# Patient Record
Sex: Female | Born: 1945 | Race: White | Hispanic: No | Smoking: Former smoker
Health system: Southern US, Community
[De-identification: ages and names within clinical notes are randomized; demographics above are authoritative.]

## PROBLEM LIST (undated history)

## (undated) DIAGNOSIS — Z972 Presence of dental prosthetic device (complete) (partial): Secondary | ICD-10-CM

## (undated) HISTORY — PX: COLON SURGERY: SHX602

## (undated) HISTORY — DX: Presence of dental prosthetic device (complete) (partial): Z97.2

---

## 1998-08-05 ENCOUNTER — Other Ambulatory Visit: Admission: RE | Admit: 1998-08-05 | Discharge: 1998-08-05 | Payer: Self-pay | Admitting: Obstetrics & Gynecology

## 1999-08-06 ENCOUNTER — Other Ambulatory Visit: Admission: RE | Admit: 1999-08-06 | Discharge: 1999-08-06 | Payer: Self-pay | Admitting: Obstetrics and Gynecology

## 1999-11-12 ENCOUNTER — Other Ambulatory Visit: Admission: RE | Admit: 1999-11-12 | Discharge: 1999-11-12 | Payer: Self-pay | Admitting: Obstetrics and Gynecology

## 1999-12-02 ENCOUNTER — Encounter (INDEPENDENT_AMBULATORY_CARE_PROVIDER_SITE_OTHER): Payer: Self-pay

## 1999-12-02 ENCOUNTER — Other Ambulatory Visit: Admission: RE | Admit: 1999-12-02 | Discharge: 1999-12-02 | Payer: Self-pay | Admitting: Obstetrics and Gynecology

## 2001-09-17 ENCOUNTER — Other Ambulatory Visit: Admission: RE | Admit: 2001-09-17 | Discharge: 2001-09-17 | Payer: Self-pay | Admitting: Obstetrics and Gynecology

## 2001-12-26 HISTORY — PX: CHOLECYSTECTOMY: SHX55

## 2002-03-01 ENCOUNTER — Other Ambulatory Visit: Admission: RE | Admit: 2002-03-01 | Discharge: 2002-03-01 | Payer: Self-pay | Admitting: Obstetrics and Gynecology

## 2002-09-27 ENCOUNTER — Encounter: Payer: Self-pay | Admitting: Family Medicine

## 2002-09-27 ENCOUNTER — Ambulatory Visit (HOSPITAL_COMMUNITY): Admission: RE | Admit: 2002-09-27 | Discharge: 2002-09-27 | Payer: Self-pay | Admitting: Family Medicine

## 2002-10-14 ENCOUNTER — Encounter: Payer: Self-pay | Admitting: General Surgery

## 2002-10-21 ENCOUNTER — Encounter: Payer: Self-pay | Admitting: General Surgery

## 2002-10-21 ENCOUNTER — Encounter (INDEPENDENT_AMBULATORY_CARE_PROVIDER_SITE_OTHER): Payer: Self-pay

## 2002-10-21 ENCOUNTER — Observation Stay (HOSPITAL_COMMUNITY): Admission: RE | Admit: 2002-10-21 | Discharge: 2002-10-22 | Payer: Self-pay | Admitting: General Surgery

## 2003-04-11 ENCOUNTER — Other Ambulatory Visit: Admission: RE | Admit: 2003-04-11 | Discharge: 2003-04-11 | Payer: Self-pay | Admitting: Obstetrics and Gynecology

## 2004-05-10 ENCOUNTER — Other Ambulatory Visit: Admission: RE | Admit: 2004-05-10 | Discharge: 2004-05-10 | Payer: Self-pay | Admitting: Obstetrics and Gynecology

## 2004-05-21 ENCOUNTER — Encounter: Admission: RE | Admit: 2004-05-21 | Discharge: 2004-05-21 | Payer: Self-pay | Admitting: Obstetrics and Gynecology

## 2004-12-09 ENCOUNTER — Other Ambulatory Visit: Admission: RE | Admit: 2004-12-09 | Discharge: 2004-12-09 | Payer: Self-pay | Admitting: Obstetrics and Gynecology

## 2005-06-20 ENCOUNTER — Other Ambulatory Visit: Admission: RE | Admit: 2005-06-20 | Discharge: 2005-06-20 | Payer: Self-pay | Admitting: Obstetrics and Gynecology

## 2005-12-05 ENCOUNTER — Other Ambulatory Visit: Admission: RE | Admit: 2005-12-05 | Discharge: 2005-12-05 | Payer: Self-pay | Admitting: Obstetrics and Gynecology

## 2009-05-27 ENCOUNTER — Encounter: Admission: RE | Admit: 2009-05-27 | Discharge: 2009-05-27 | Payer: Self-pay | Admitting: Family Medicine

## 2009-06-01 ENCOUNTER — Encounter: Admission: RE | Admit: 2009-06-01 | Discharge: 2009-06-01 | Payer: Self-pay | Admitting: Family Medicine

## 2010-02-25 IMAGING — CR DG FOOT 2V*R*
2 series · 2 of 2 positions shown · non-contrast
Comparison: None

CLINICAL DATA: Foot and ankle pain

RIGHT FOOT - 2 VIEW

[view not recorded (1 of 2)]
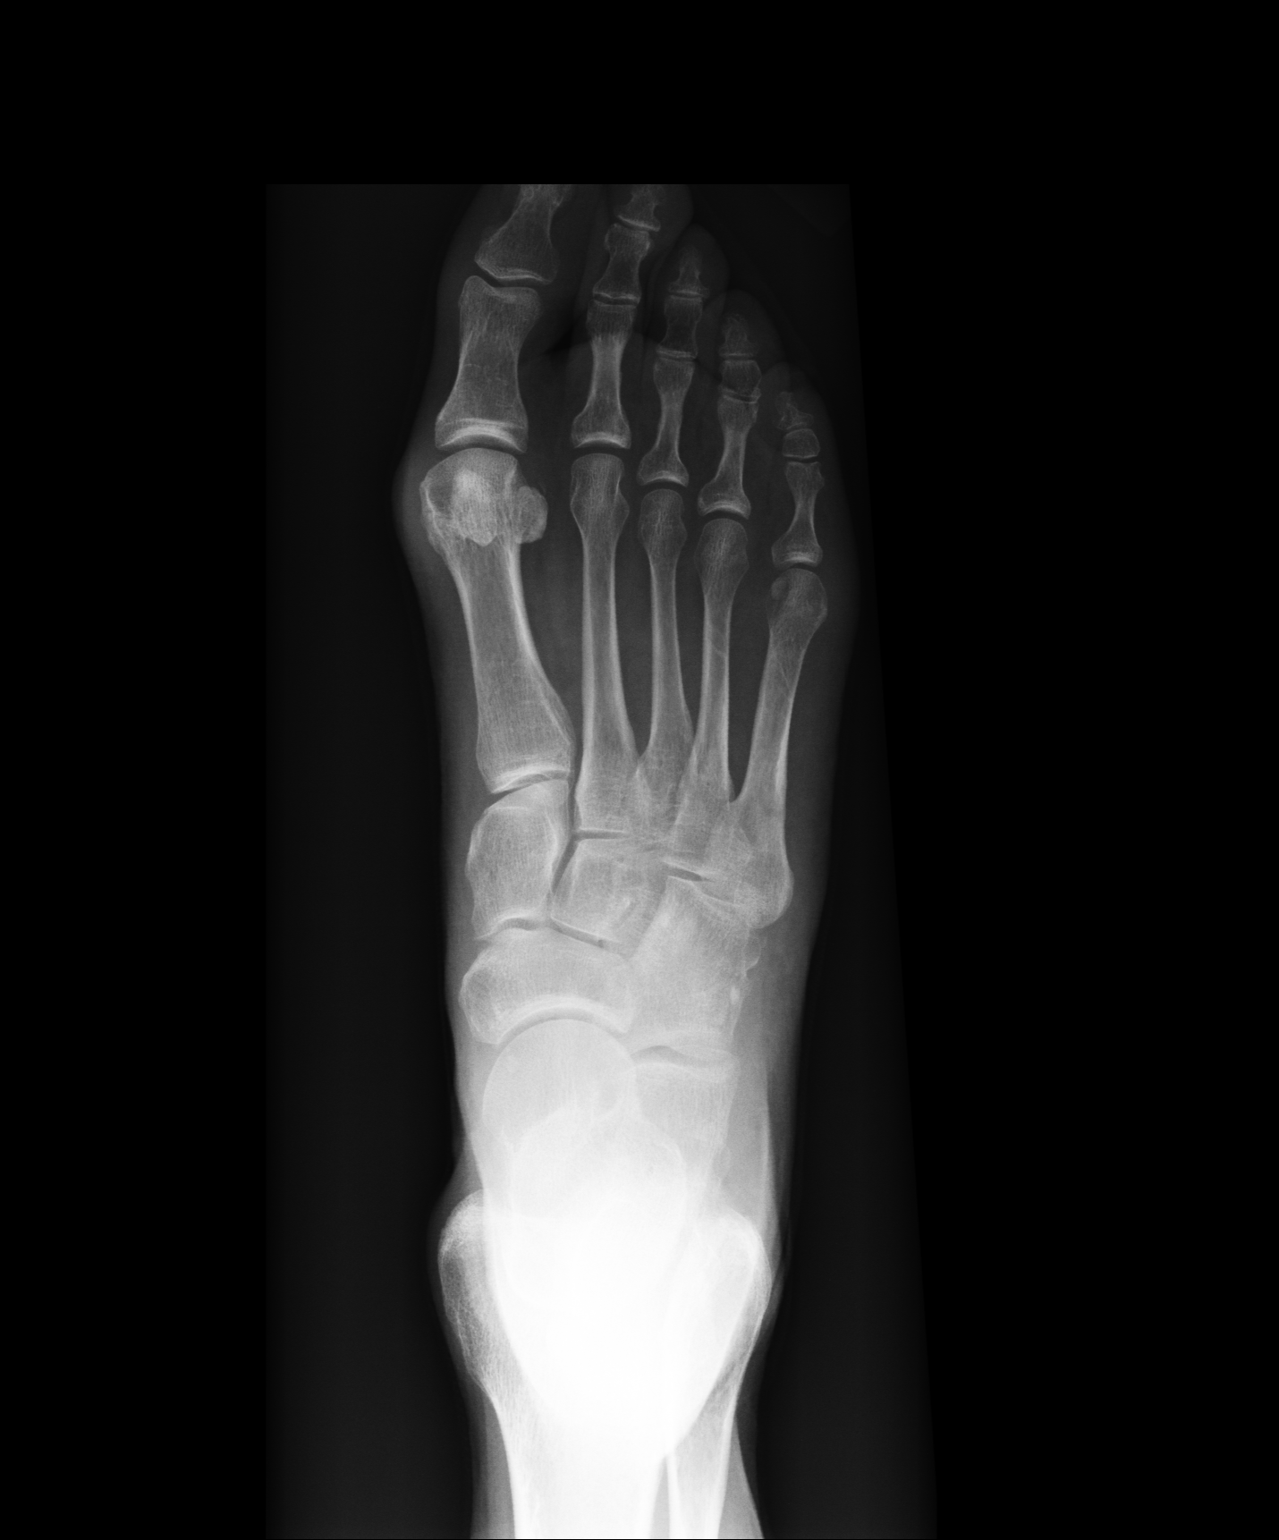

[view not recorded (2 of 2)]
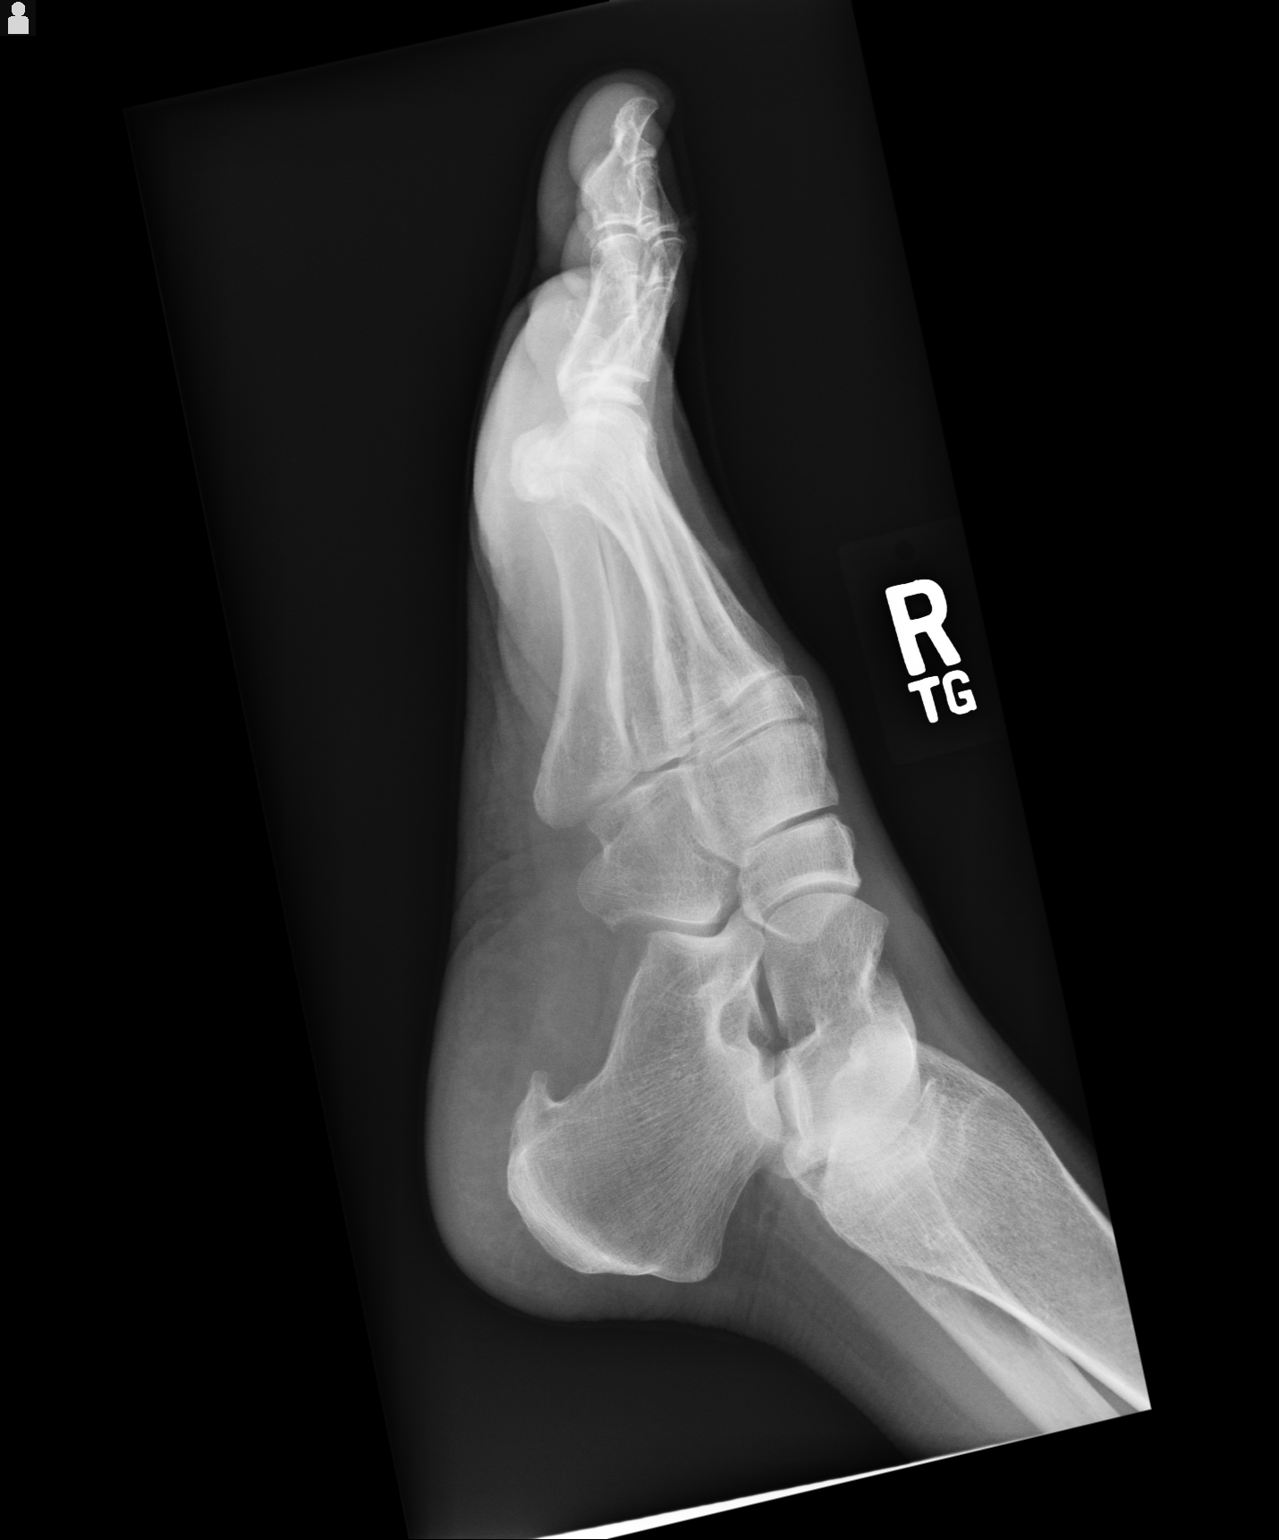

[2 of 2 positions shown; findings below may reference images not displayed]

FINDINGS: There is lucency at the head of the fifth metatarsal of
unknown significance.  No cortical breakthrough or periosteal
reaction.  Soft tissues are within normal limits.  No acute
fracture or dislocation.  Mild degenerative change of the first
metatarsal-phalangeal joint.  Spurring at the inferior calcaneus.
IMPRESSION: No acute bony injury.

Degenerative change

Apparent lytic area at the head of the fifth metatarsal.  Complete
three view study is recommended.

## 2011-05-13 NOTE — Op Note (Signed)
NAME:  Erika Buck, Erika Buck                            ACCOUNT NO.:  [id number]   MEDICAL RECORD NO.:  [id number]                   PATIENT TYPE:  AMB   LOCATION:  DAY                                  FACILITY:  [facility]   PHYSICIAN:  Ollen Gross. Vernell Morgans, M.D.              DATE OF BIRTH:  10-04-1946   DATE OF PROCEDURE:  10/21/2002  DATE OF DISCHARGE:                                 OPERATIVE REPORT   PREOPERATIVE DIAGNOSES:  Cholelithiasis with cholecystitis.   POSTOPERATIVE DIAGNOSES:  Cholelithiasis with cholecystitis.   PROCEDURE:  Laparoscopic cholecystectomy with intraoperative cholangiogram.   SURGEON:  Ollen Gross. Carolynne Edouard, M.D.   ASSISTANT:  Abigail Miyamoto, MD   ANESTHESIA:  General endotracheal.   DESCRIPTION OF PROCEDURE:  After informed consent was obtained, the patient  was brought to the operating room, placed in supine position on the  operating room table. After adequate induction of general anesthesia, the  patient's abdomen was prepped with Betadine and draped in the usual sterile  manner. The area below the umbilicus was infiltrated with 0.25% Marcaine. A  small incision was made with a 15 blade knife. Blunt dissection was then  carried out through the subcutaneous tissue using blunt dissection with the  Kelly clamp and [employer] retractors until the linea alba was identified.  The linea was incised with the 15 blade knife and each side was grasped with  Kocher clamps and elevated anteriorly. The preperitoneal space was then  probed bluntly with a hemostat until the peritoneum was opened and access  was gained to the abdominal cavity. A finger was inserted through this  opening and there were no obvious adhesions to the anterior abdominal wall.  A #0 Vicryl pursestring stitch was placed in the fascia surrounding this  opening. A Hasson cannula was then placed through this opening and anchored  in place with the previously placed Vicryl pursestring stitch. The abdomen  was then  insufflated with carbon dioxide without difficulty. The patient  placed in a head up position, laparoscope was placed through the Hasson  cannula and the gallbladder appeared to be very large and floppy but was  readily identifiable. Next after infiltrating the area with 0.25% Marcaine,  a small upper midline transverse incision was made with a 15 blade knife. A  10 mm port was then placed bluntly through this incision into the abdominal  cavity under direct vision. Sites were then chosen laterally on the right  side of the abdomen for placement of the 5 mm ports. Each of these areas was  infiltrated with 0.25% Marcaine. Small stab incisions were made with the 15  blade knife. The 5 mm ports were then placed through these incisions into  the abdominal cavity under direct vision. A blunt grasper was placed through  the lateral most 5 mm port and used to grasp the dome of the gallbladder and  elevate it anteriorly  and superiorly. Another blunt grasper was placed  through the other 5 mm port and used to retract on the body and neck of the  gallbladder. A dissector was placed through the upper midline port. Using  the electrocautery, the peritoneal reflection at the gallbladder neck was  opened, blunt dissection was then carried out in this area until the  gallbladder cystic duct junction was readily identified and dissected in a  circumferential manner. Care was taken to keep the common duct medial to  this dissection. Once this was accomplished, a clip was placed on the  gallbladder neck, a small ductotomy was made with the laparoscopic scissors.  A 12 gauge angiocath was placed percutaneously through the anterior  abdominal wall and a Reddick catheter was placed through the angiocath. The  catheter was placed within the cystic duct and anchored in placed with a  clip. Next, a cholangiogram was obtained that showed good filling of the  bile duct system. There were no filling defects and there  was rapid emptying  into the duodenum. The patient was then repositioned, the clip was removed  and the angiocath and Reddick catheter were removed from the patient. There  clips were then placed proximally on the cystic duct and the duct was  divided between the two sets of clips. Posterior to this, the cystic artery  was identified and again dissected in a circumferential manner. Two clips  were placed proximally and one distally on the artery and the artery was  divided between the two. Next, the laparoscopic hook device was used to  separate the gallbladder from the liver bed. Prior to completely detaching  the gallbladder from the liver bed, the liver bed was inspected and several  small bleeding points were coagulated with the electrocautery. The  gallbladder was then detached the rest of the way of the liver bed. An  endoscopic bag was placed through the upper midline port and the gallbladder  was placed within the bag and the bag was sealed. The laparoscope was then  moved to the upper midline port and a gallbladder grasper was placed through  the Hasson cannula. I then grasped the opening of the bag. The bag with the  gallbladder was then removed with the Hasson cannula through the  infraumbilical port without difficulty. The fascial defect was then closed  with the previously placed Vicryl pursestring stitch. An extra Vicryl stitch  was used to finish closing the fascial defect. The abdomen was then  irrigated with copious amounts of saline until the affluent was clear. The  liver bed was inspected and again found to be hemostatic. The ports were  then all removed under direct vision and the gas was allowed to escape. The  skin incisions were closed with interrupted 4-0 Monocryl subcuticular  stitches. Benzoin and Steri-Strips were applied. The patient tolerated the  procedure well. At the end of the case, all sponge, needle and instrument counts were correct. The patient was  awakened and taken to the recovery room  in stable condition.                                               Ollen Gross. Vernell Morgans, M.D.    PST/MEDQ  D:  10/21/2002  T:  10/21/2002  Job:  [id number]

## 2011-07-08 ENCOUNTER — Other Ambulatory Visit: Payer: Self-pay | Admitting: Gastroenterology

## 2011-08-02 ENCOUNTER — Encounter (INDEPENDENT_AMBULATORY_CARE_PROVIDER_SITE_OTHER): Payer: Self-pay | Admitting: Surgery

## 2011-08-02 ENCOUNTER — Ambulatory Visit (INDEPENDENT_AMBULATORY_CARE_PROVIDER_SITE_OTHER): Payer: 59 | Admitting: Surgery

## 2011-08-02 VITALS — BP 144/90 | HR 70 | Temp 97.4°F | Ht 64.0 in | Wt 102.4 lb

## 2011-08-02 DIAGNOSIS — K6389 Other specified diseases of intestine: Secondary | ICD-10-CM | POA: Insufficient documentation

## 2011-08-02 NOTE — Progress Notes (Signed)
Erika Buck is a 65 y.o. female.    Chief Complaint  Patient presents with  . 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 It was too large to be removed endoscopically. She is asymptomatic. She has no problems moving her bowels. She has no obstructive symptoms. The pathology showed no high-grade dysplasia. She is otherwise without complaints.  Past Medical History  Diagnosis Date  . Wears dentures     Past Surgical History  Procedure Date  . Cholecystectomy 2003    Family History  Problem Relation Age of Onset  . Heart disease Mother   . Cancer Brother     Social History History  Substance Use Topics  . Smoking status: Current Everyday Smoker -- 0.5 packs/day  . Smokeless tobacco: Not on file  . Alcohol Use: No    No Known Allergies  Current Outpatient Prescriptions  Medication Sig Dispense Refill  . Alendronate Sodium (FOSAMAX PO) Take by mouth once a week.          Review of Systems ROS An extensive 16 point review of systems was reviewed with the patient. It is negative from a cardiopulmonary standpoint and otherwise negative. Physical Exam Physical Exam  Constitutional: She is oriented to person, place, and time. She appears well-developed and well-nourished. No distress.  HENT:  Head: Normocephalic and atraumatic.  Right Ear: External ear normal.  Left Ear: External ear normal.  Nose: Nose normal.  Mouth/Throat: Oropharynx is clear and moist. No oropharyngeal exudate.  Eyes: Conjunctivae are normal. Pupils are equal, round, and reactive to light. No scleral icterus.  Neck: Normal range of motion. Neck supple. No tracheal deviation present. No thyromegaly present.  Cardiovascular: Normal rate, regular rhythm, normal heart sounds and intact distal pulses.   No murmur heard. Respiratory: Effort normal and breath sounds normal. No  respiratory distress. She has no wheezes. She has no rales.  GI: Soft. Bowel sounds are normal. She exhibits no distension and no mass. There is no tenderness.  Musculoskeletal: Normal range of motion. She exhibits no edema and no tenderness.  Lymphadenopathy:    She has no cervical adenopathy.    She has no axillary adenopathy.       Right: No inguinal adenopathy present.       Left: No inguinal adenopathy present.  Neurological: She is alert and oriented to person, place, and time.  Skin: Skin is warm and dry. No rash noted. No erythema.  Psychiatric: Her behavior is normal. Judgment normal.     Blood pressure 144/90, pulse 70, temperature 97.4 F (36.3 C), temperature source Temporal, height 5\' 4"  (1.626 m), weight 102 lb 6.4 oz (46.448 kg).  Assessment/Plan This is a patient with a large tubular adenoma of the cecum which could not be removed endoscopically. Left ectopic assisted right partial colectomy is recommended. I discussed this with the patient and her family. I discussed the risk of surgery which include but are not limited to bleeding, infection, anastomotic leak, anastomotic breakdown, need for an ostomy, injury to other structures, etc. She understands and wishes to proceed. Surgery will be scheduled.  Cleofas Hudgins A 08/02/2011, 11:43 AM

## 2011-09-12 ENCOUNTER — Other Ambulatory Visit (INDEPENDENT_AMBULATORY_CARE_PROVIDER_SITE_OTHER): Payer: Self-pay | Admitting: Surgery

## 2011-09-12 ENCOUNTER — Encounter (HOSPITAL_COMMUNITY): Payer: 59

## 2011-09-12 LAB — CBC
HCT: 42.5 % (ref 36.0–46.0)
MCH: 32.3 pg (ref 26.0–34.0)
MCV: 95.3 fL (ref 78.0–100.0)
Platelets: 306 10*3/uL (ref 150–400)
RBC: 4.46 MIL/uL (ref 3.87–5.11)
WBC: 8.7 10*3/uL (ref 4.0–10.5)

## 2011-09-16 ENCOUNTER — Other Ambulatory Visit (INDEPENDENT_AMBULATORY_CARE_PROVIDER_SITE_OTHER): Payer: Self-pay | Admitting: Surgery

## 2011-09-16 ENCOUNTER — Inpatient Hospital Stay (HOSPITAL_COMMUNITY)
Admission: RE | Admit: 2011-09-16 | Discharge: 2011-09-20 | DRG: 330 | Disposition: A | Discharge status: Home / Self Care | Payer: 59 | Source: Ambulatory Visit | Attending: Surgery | Admitting: Surgery

## 2011-09-16 DIAGNOSIS — Z5331 Laparoscopic surgical procedure converted to open procedure: Secondary | ICD-10-CM

## 2011-09-16 DIAGNOSIS — D371 Neoplasm of uncertain behavior of stomach: Secondary | ICD-10-CM

## 2011-09-16 DIAGNOSIS — D126 Benign neoplasm of colon, unspecified: Principal | ICD-10-CM | POA: Diagnosis present

## 2011-09-16 DIAGNOSIS — K921 Melena: Secondary | ICD-10-CM | POA: Diagnosis not present

## 2011-09-16 DIAGNOSIS — Z01812 Encounter for preprocedural laboratory examination: Secondary | ICD-10-CM

## 2011-09-16 DIAGNOSIS — D378 Neoplasm of uncertain behavior of other specified digestive organs: Secondary | ICD-10-CM

## 2011-09-16 DIAGNOSIS — F17200 Nicotine dependence, unspecified, uncomplicated: Secondary | ICD-10-CM | POA: Diagnosis present

## 2011-09-16 DIAGNOSIS — D375 Neoplasm of uncertain behavior of rectum: Secondary | ICD-10-CM

## 2011-09-16 HISTORY — PX: PARTIAL COLECTOMY: SHX5273

## 2011-09-17 LAB — CBC
Hemoglobin: 10.8 g/dL — ABNORMAL LOW (ref 12.0–15.0)
Hemoglobin: 10.6 g/dL — ABNORMAL LOW (ref 12.0–15.0)
MCH: 32.5 pg (ref 26.0–34.0)
MCH: 32.6 pg (ref 26.0–34.0)
MCHC: 33.8 g/dL (ref 30.0–36.0)
Platelets: 268 10*3/uL (ref 150–400)
Platelets: 257 10*3/uL (ref 150–400)
RBC: 3.25 MIL/uL — ABNORMAL LOW (ref 3.87–5.11)
RDW: 13.4 % (ref 11.5–15.5)
WBC: 14.7 10*3/uL — ABNORMAL HIGH (ref 4.0–10.5)

## 2011-09-17 LAB — BASIC METABOLIC PANEL
Calcium: 8.1 mg/dL — ABNORMAL LOW (ref 8.4–10.5)
GFR calc Af Amer: >60 mL/min (ref >60)
GFR calc non Af Amer: >60 mL/min (ref >60)
Potassium: 4 mEq/L (ref 3.5–5.1)
Sodium: 140 mEq/L (ref 135–145)

## 2011-09-17 LAB — DIFFERENTIAL
Basophils Absolute: 0 10*3/uL (ref 0.0–0.1)
Basophils Relative: 0 % (ref 0–1)
Eosinophils Absolute: 0 10*3/uL (ref 0.0–0.7)
Monocytes Absolute: 1.6 10*3/uL — ABNORMAL HIGH (ref 0.1–1.0)
Monocytes Relative: 11 % (ref 3–12)
Neutro Abs: 11.2 10*3/uL — ABNORMAL HIGH (ref 1.7–7.7)

## 2011-09-18 LAB — CBC
HCT: 32.6 % — ABNORMAL LOW (ref 36.0–46.0)
MCV: 97.6 fL (ref 78.0–100.0)
RBC: 3.34 MIL/uL — ABNORMAL LOW (ref 3.87–5.11)
RDW: 13.4 % (ref 11.5–15.5)
WBC: 11.9 10*3/uL — ABNORMAL HIGH (ref 4.0–10.5)

## 2011-09-18 LAB — BASIC METABOLIC PANEL
BUN: 5 mg/dL — ABNORMAL LOW (ref 6–23)
CO2: 26 mEq/L (ref 19–32)
Chloride: 108 mEq/L (ref 96–112)
GFR calc Af Amer: >60 mL/min (ref >60)
Potassium: 4.7 mEq/L (ref 3.5–5.1)

## 2011-09-19 LAB — CBC
HCT: 32.5 % — ABNORMAL LOW (ref 36.0–46.0)
Hemoglobin: 10.8 g/dL — ABNORMAL LOW (ref 12.0–15.0)
MCHC: 33.2 g/dL (ref 30.0–36.0)
MCV: 97.6 fL (ref 78.0–100.0)
WBC: 9.8 10*3/uL (ref 4.0–10.5)

## 2011-09-20 LAB — TYPE AND SCREEN
ABO/RH(D): O POS
Antibody Screen: POSITIVE
DAT, IgG: POSITIVE
PT AG Type: POSITIVE

## 2011-09-26 ENCOUNTER — Encounter (INDEPENDENT_AMBULATORY_CARE_PROVIDER_SITE_OTHER): Payer: Self-pay

## 2011-09-26 NOTE — Op Note (Signed)
Erika Buck, MEDLIN                  ACCOUNT NO.:  [id number]  MEDICAL RECORD NO.:  [id number]  LOCATION:  1528                         FACILITY:  [facility]  PHYSICIAN:  Abigail Miyamoto, M.D. DATE OF BIRTH:  [birth year]  DATE OF PROCEDURE:  09/16/2011 DATE OF DISCHARGE:                              OPERATIVE REPORT   PREOPERATIVE DIAGNOSIS:  Colon mass.  POSTOPERATIVE DIAGNOSIS:  Colon mass.  PROCEDURE:  Laparoscopic-assisted right partial colectomy.  SURGEON:  Abigail Miyamoto, M.D.  ASSISTANT:  Sandria Bales. Ezzard Standing, M.D.  ANESTHESIA:  General endotracheal anesthesia and injectable Exparel.  ESTIMATED BLOOD LOSS:  Minimal.  INDICATIONS:  Erika Buck is a 65 year old female who had undergone a colonoscopy.  She was found to have a 2 to 2-1/2  cm tubular adenoma, which was carpeting the cecum, adjacent to the appendiceal orifice.  It could not be removed endoscopically, so decision was made to proceed with partial colectomy. FINDINGS:  The patient was indeed found to have the lesion as described in the cecum, adjacent to the appendiceal orifice  PROCEDURE IN DETAIL:  The patient was brought to the operative room, identified as Erika Buck.  She was placed supine on the operating room table and general anesthesia was induced.  Her abdomen was then prepped and draped in usual sterile fashion.  Using a #15 blade, a small vertical incision was made at a previous scar just below the umbilicus. This was carried down to the fascia, which was opened the scalpel.  A hemostat was then used to pass through the peritoneal cavity under direct vision.  A 0 Vicryl pursestring suture was then placed around the fascial opening.  The Hasson port was placed in the opening and insufflation of the abdomen was begun. I then placed a 5-mm port just below this in the patient's midline and another in the right upper quadrant, both under direct vision.  The cecum, the right colon were found to be quite  floppy; I was able to easily mobilize the white line of Toldt with Harmonic scalpel.  Doing this, allowed me to bring the entire right colon and cecum past the midline.  At this point, decision was made to go ahead and convert to an open procedure.  I then removed all ports and deflated the abdomen.  I connected the umbilical port site with the lower port site with scalpel, took this down through the fascia with electrocautery.  I was then able to grasp the cecum and pulled it up easily out of the incision, it was very difficult to palpate the lesion in the cecum.  It was difficult to ascertain its location to the ileocecal valve, so decision was made to proceed with a partial colectomy, then included the ileocecal valve.  I took down the mesentery of the appendix, right colon, and the terminal ileum with __________ electrocautery device.  I then transected the terminal ileum and the proximal right colon of the GIA-75 stapler.  I took the specimen to the back table and opened it up, and confirmed that the suspicious lesion was located as expected.  At this point, I then reapproximated the small bowel to the colon along the tinea  with interrupted silk sutures.  I then created a colotomy and enterotomy, and then performed a side-to- side anastomosis with a single firing of the GIA-75 stapler.  I then closed the opening with a TA-90 stapler.  A large anastomosis, which was viable, without any __________ created.  I then closed the mesenteric defect with interrupted 3-0 silk sutures.  I again evaluated the anastomosis and it appeared to be intact .  I then thoroughly irrigated the abdomen with normal saline.  Hemostasis appeared to be achieved.  I then closed the patient's midline fascia with a running looped #1 PDS suture.  The skin was injected with IV Exparel.  I closed both the right upper quadrant port site and the midline incision with a running 4-0 Monocryl suture.  Steri-Strips and  Band-Aids were applied.  The patient tolerated the procedure well.  All counts were correct at the end of the procedure.  The patient was then extubated in the operating room and taken in stable condition to recovery room.     Abigail Miyamoto, M.D.     DB/MEDQ  D:  09/16/2011  T:  09/16/2011  Job:  [id number]  Electronically Signed by Abigail Miyamoto M.D. on 09/26/2011 09:10:57 AM

## 2011-09-27 ENCOUNTER — Ambulatory Visit (INDEPENDENT_AMBULATORY_CARE_PROVIDER_SITE_OTHER): Payer: 59 | Admitting: Surgery

## 2011-09-27 ENCOUNTER — Encounter (INDEPENDENT_AMBULATORY_CARE_PROVIDER_SITE_OTHER): Payer: Self-pay | Admitting: Surgery

## 2011-09-27 VITALS — BP 130/88 | HR 76 | Temp 98.4°F | Resp 12 | Ht 64.0 in | Wt 99.2 lb

## 2011-09-27 DIAGNOSIS — Z9049 Acquired absence of other specified parts of digestive tract: Secondary | ICD-10-CM

## 2011-09-27 DIAGNOSIS — Z9889 Other specified postprocedural states: Secondary | ICD-10-CM

## 2011-09-27 NOTE — Progress Notes (Signed)
Subjective:     Patient ID: Erika Buck, female   DOB: 03-25-1946, 65 y.o.   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 She is doing very well. She has absolutely no complaints. She is eating well and moving her bowels well. She has had no fever  Review of Systems     Objective:   Physical Exam On exam, her Steri-Strips are still in place. Her abdomen is soft and her incision is healing very well    Assessment:     Patient doing well postop    Plan:     She will still refrain from heavy lifting. I will see her back in 2 weeks

## 2011-09-28 ENCOUNTER — Encounter (INDEPENDENT_AMBULATORY_CARE_PROVIDER_SITE_OTHER): Payer: Self-pay | Admitting: Surgery

## 2011-10-10 ENCOUNTER — Encounter (INDEPENDENT_AMBULATORY_CARE_PROVIDER_SITE_OTHER): Payer: Self-pay | Admitting: Surgery

## 2011-10-11 ENCOUNTER — Encounter (INDEPENDENT_AMBULATORY_CARE_PROVIDER_SITE_OTHER): Payer: Self-pay | Admitting: Surgery

## 2011-10-11 ENCOUNTER — Ambulatory Visit (INDEPENDENT_AMBULATORY_CARE_PROVIDER_SITE_OTHER): Payer: 59 | Admitting: Surgery

## 2011-10-11 VITALS — BP 142/88 | HR 72 | Temp 98.2°F | Resp 16 | Ht 64.0 in | Wt 101.4 lb

## 2011-10-11 DIAGNOSIS — Z09 Encounter for follow-up examination after completed treatment for conditions other than malignant neoplasm: Secondary | ICD-10-CM

## 2011-10-11 NOTE — Progress Notes (Signed)
Subjective:     Patient ID: Erika Buck, female   DOB: 26-Apr-1946, 65 y.o.   MRN: [id number]  HPI She is here for another postoperative visit status post laparoscopic assisted right partial colectomy. She is doing very well and has no complaints. She is moving her bowels well  Review of Systems     Objective:   Physical Exam    On exam, her incision is well-healed. Assessment:     Patient doing well status post lap assisted right partial colectomy    Plan:     She will return to work next week. She will refrain for heavy lifting until November 1. I will see her back as needed

## 2011-11-02 NOTE — H&P (Signed)
  NAMEALTON, TREMBLAY NO.:  [id number]  MEDICAL RECORD NO.:  [id number]  LOCATION:  1528                         FACILITY:  [facility]  PHYSICIAN:  Abigail Miyamoto, M.D. DATE OF BIRTH:  [birth year]  DATE OF ADMISSION:  09/16/2011 DATE OF DISCHARGE:  09/20/2011                             HISTORY & PHYSICAL   I dictated a history and physical on the epic system and it was there in the chart the day of her surgery and now has been lost and I am having to re-dictated as the hospital had lost it, so I did not have that information right here currently on me.  CHIEF COMPLAINT:  Colon mass.  HISTORY:  This is a 65 year old female with a mass in right colon who presents for elective laparoscopic assisted right hemicolectomy.  This was found on recent scan.  Past medical history, past surgical history, medications, etc please see epic.  ALLERGIES:  None known.  PHYSICAL EXAMINATION:  GENERAL:  This is a well-developed, well- nourished female in no acute distress. VITAL SIGNS:  She is afebrile vital signs stable. LUNGS:  Clear to auscultation bilaterally with normal respiratory effort. CARDIOVASCULAR:  Regular rate and rhythm.  There are no murmurs.  No peripheral edema. ABDOMEN:  Soft, nontender, nondistended.  There are no masses.  No organomegaly. EXTREMITIES:  Warm and well perfused.  No edema, clubbing, or cyanosis.  DATA REVIEW:  I have been noted the patient's pathology and endoscopy which I had reviewed.  IMPRESSION:  The patient with a right colon mass.  PLAN:  Decision has been made to proceed with a laparoscopic assisted right hemicolectomy.  I discussed risks, surgery which include but not limited to bleeding, infection, anastomotic leak, need for further surgery, etc.  She understands and wished to proceed.  Surgery is thus scheduled.  Again, please see epic for the real history and physical.    Abigail Miyamoto, M.D.    DB/MEDQ  D:   10/20/2011  T:  10/20/2011  Job:  [id number]  Electronically Signed by Abigail Miyamoto M.D. on 11/02/2011 09:46:41 AM

## 2011-11-02 NOTE — Discharge Summary (Signed)
[name], Erika Buck                  ACCOUNT NO.:  [id number]  MEDICAL RECORD NO.:  [id number]  LOCATION:  1528                         FACILITY:  [facility]  PHYSICIAN:  Abigail Miyamoto, M.D. DATE OF BIRTH:  1946/08/28  DATE OF ADMISSION:  09/16/2011 DATE OF DISCHARGE:  09/20/2011                              DISCHARGE SUMMARY   DISCHARGE DIAGNOSIS:  Colon mass, status post laparoscopic assisted right partial colectomy.  HISTORY:  This is a 65 year old female who was found to have a tubular adenoma in the cecum of recent colonoscopy.  Decision was made to proceed with laparoscopic partial colectomy.  HOSPITAL COURSE:  The patient was admitted and taken to the operating room, where she underwent a laparoscopic assisted right partial colectomy.  She was tolerated the procedure well and taken to a regular surgical floor.  On postop day #1, she has had some bloody bowel movements, but her hemoglobin was stable.  She continued to do well, had no further signs of bleeding.  By postoperative day #3, she was tolerating regular diet.  Her hemoglobin again remained stable.  Her PSA was stopped.  By postoperative day #4, she was tolerating a regular diet.  She has having normal bowel movements.  Her abdomen was soft. Her incision was healing well and decision was made to discharge the patient to home.  DISCHARGE DIET:  Regular.  DISCHARGE ACTIVITY:  She will do no heavy lifting greater than 20 pounds for 4 to 6 weeks.  DISCHARGE FOLLOWUP:  She will follow up in my office in 1 week post discharge.     Abigail Miyamoto, M.D.     DB/MEDQ  D:  11/02/2011  T:  11/02/2011  Job:  [id number]

## 2013-09-19 ENCOUNTER — Other Ambulatory Visit: Payer: Self-pay | Admitting: Gastroenterology

## 2015-01-14 ENCOUNTER — Other Ambulatory Visit: Payer: Self-pay | Admitting: Obstetrics and Gynecology

## 2015-01-15 LAB — CYTOLOGY - PAP

## 2019-11-25 ENCOUNTER — Emergency Department (HOSPITAL_COMMUNITY)
Admission: EM | Admit: 2019-11-25 | Discharge: 2019-11-25 | Disposition: A | Discharge status: Home / Self Care | Payer: Medicare Other | Attending: Emergency Medicine | Admitting: Emergency Medicine

## 2019-11-25 ENCOUNTER — Other Ambulatory Visit: Payer: Self-pay

## 2019-11-25 ENCOUNTER — Encounter (HOSPITAL_COMMUNITY): Payer: Self-pay | Admitting: Emergency Medicine

## 2019-11-25 DIAGNOSIS — R040 Epistaxis: Secondary | ICD-10-CM | POA: Diagnosis not present

## 2019-11-25 DIAGNOSIS — W19XXXA Unspecified fall, initial encounter: Secondary | ICD-10-CM | POA: Diagnosis not present

## 2019-11-25 DIAGNOSIS — F17200 Nicotine dependence, unspecified, uncomplicated: Secondary | ICD-10-CM | POA: Insufficient documentation

## 2019-11-25 MED ORDER — SILVER NITRATE-POT NITRATE 75-25 % EX MISC
1.0000 "application " | Freq: Once | CUTANEOUS | Status: AC
  Administered 2019-11-25: 22:00:00 1 via TOPICAL

## 2019-11-25 MED ORDER — OXYMETAZOLINE HCL 0.05 % NA SOLN
1.0000 | Freq: Once | NASAL | Status: AC
  Administered 2019-11-25: 19:00:00 1 via NASAL
  Filled 2019-11-25: qty 30

## 2019-11-25 MED ORDER — SALINE SPRAY 0.65 % NA SOLN
1.0000 | NASAL | Status: DC | PRN
  Filled 2019-11-25: qty 44

## 2019-11-25 MED ORDER — SILVER NITRATE-POT NITRATE 75-25 % EX MISC
CUTANEOUS | Status: AC
  Administered 2019-11-25: 1 via TOPICAL
  Filled 2019-11-25: qty 20

## 2019-11-25 NOTE — ED Provider Notes (Signed)
[facility] DEPT Provider Note   CSN: [id number] Arrival date & time: 11/25/19  1825     History   Chief Complaint Chief Complaint  Patient presents with  . Fall  . Epistaxis    HPI Erika Buck is a 73 y.o. female who presents to the ED with epistaxis after sustaining mechanical fall.  Patient reportedly tripped and landed on her face.  She has never had a nosebleed before.  She denies any blood thinners.  She denies any loss of consciousness, confusion, nausea or vomiting, blurred vision, or other neurologic deficits.  She reports that her nose took the brunt of the fall.  She can breathe through the left side of her nose, but not the right.  Her right nare is clogged and continuing to bleed.  Nurse administered Afrin which helped achieve hemostasis temporarily before patient coughed of bloody phlegm consequently restarting epistaxis.  Patient denies any recent illness, fevers or chills, dizziness or lightheadedness, history of bleeding disorders, or other symptoms.     HPI  Past Medical History:  Diagnosis Date  . Wears dentures     Patient Active Problem List   Diagnosis Date Noted  . S/P partial colectomy 09/27/2011  . Colonic mass 08/02/2011    Past Surgical History:  Procedure Laterality Date  . CHOLECYSTECTOMY  2003  . COLON SURGERY    . PARTIAL COLECTOMY  09/16/2011   Laparoscopic assisted right     OB History   No obstetric history on file.      Home Medications    Prior to Admission medications   Medication Sig Start Date End Date Taking? Authorizing Provider  Alendronate Sodium (FOSAMAX PO) Take by mouth once a week.      [provider]    Family History Family History  Problem Relation Age of Onset  . Heart disease Mother   . Cancer Brother        adrenal glands    Social History Social History   Tobacco Use  . Smoking status: Current Every Day Smoker    Packs/day: 0.50  Substance Use Topics  .  Alcohol use: No  . Drug use: No     Allergies   Patient has no known allergies.   Review of Systems Review of Systems  Constitutional: Negative for fever.  HENT: Positive for nosebleeds. Negative for trouble swallowing.   Neurological: Negative for dizziness and light-headedness.  Hematological: Does not bruise/bleed easily.     Physical Exam Updated Vital Signs BP (!) 151/89 (BP Location: Left Arm)   Pulse 97   Temp 98.8 F (37.1 C) (Oral)   Resp 18   Ht 5\' 4"  (1.626 m)   Wt 48.1 kg   SpO2 95%   BMI 18.19 kg/m   Physical Exam Vitals signs and nursing note reviewed. Exam conducted with a chaperone present.  Constitutional:      Appearance: Normal appearance.  HENT:     Head: Normocephalic and atraumatic.  Eyes:     General: No scleral icterus.    Conjunctiva/sclera: Conjunctivae normal.  Cardiovascular:     Rate and Rhythm: Normal rate.     Pulses: Normal pulses.  Pulmonary:     Effort: Pulmonary effort is normal.  Skin:    General: Skin is dry.  Neurological:     Mental Status: She is alert.     GCS: GCS eye subscore is 4. GCS verbal subscore is 5. GCS motor subscore is 6.  Psychiatric:  Mood and Affect: Mood normal.        Behavior: Behavior normal.        Thought Content: Thought content normal.      ED Treatments / Results  Labs (all labs ordered are listed, but only abnormal results are displayed) Labs Reviewed - No data to display  EKG None  Radiology No results found.  Procedures .Epistaxis Management  Date/Time: 11/25/2019 11:22 PM Performed by: Corena Herter, PA-C Authorized by: Corena Herter, PA-C   Consent:    Consent given by:  Patient   Risks discussed:  Pain, nasal injury, infection and bleeding   Alternatives discussed:  Alternative treatment Anesthesia (see MAR for exact dosages):    Anesthesia method:  None Procedure details:    Treatment site:  L anterior   Treatment method:  Silver nitrate   Treatment  complexity:  Limited   Treatment episode: initial   Post-procedure details:    Assessment:  Bleeding stopped   Patient tolerance of procedure:  Tolerated well, no immediate complications   (including critical care time)  Medications Ordered in ED Medications  sodium chloride (OCEAN) 0.65 % nasal spray 1 spray (has no administration in time range)  oxymetazoline (AFRIN) 0.05 % nasal spray 1 spray (1 spray Each Nare Given 11/25/19 1852)  silver nitrate applicators applicator 1 application (1 application Topical Given by Other 11/25/19 2155)     Initial Impression / Assessment and Plan / ED Course  I have reviewed the triage vital signs and the nursing notes.  Pertinent labs & imaging results that were available during my care of the patient were reviewed by me and considered in my medical decision making (see chart for details).        Patient sustained mechanical fall denies any presyncopal event or prodrome leading up to her injury.  She did not lose consciousness and remembers the event in its entirety.  She denies any nausea or vomiting. She does not complain of any headache or visual disturbance. Discussed with patient and CT head not warranted. Patient denied any lightheadedness or dizziness concerning for anemia.  Encouraged increased oral hydration to compensate for her nosebleed and to follow-up with her PCP at [facility].   Performed chemical cautery right nare to achieve hemostasis.  Continued to apply direct pressure using tongue depressors.  After significant observation, removed tongue depressors and patient was able to ambulate without rebleeding.  Patient will go home with Afrin and nasal saline rinse.  She was able to breathe through her affected nare.  She felt much improved and was ready for discharge.   Provided strict return precautions including but not limited to new bleeds that do not cease with direct pressure.  Provided her with comprehensive  instructions regarding ongoing management of her epistaxis in her discharge papers. All of the evaluation and work-up results were discussed with the patient and any family at bedside. They were provided opportunity to ask any additional questions and have none at this time. They have expressed understanding of verbal discharge instructions as well as return precautions and are agreeable to the plan.    Final Clinical Impressions(s) / ED Diagnoses   Final diagnoses:  Epistaxis    ED Discharge Orders    None       Corena Herter, PA-C 11/26/19 0016    Isla Pence, MD 11/28/19 [phone]

## 2019-11-25 NOTE — Discharge Instructions (Addendum)
Should your nose begin bleeding again, hold pressure (HARD!) just below the bony part of your nose.  Hold pressure continuously for 15 minutes.  NO PEEKING!.  If bleeding continues, blow your nose to get rid of any clots.  Spray afrin x 2 into affected nostril.  Then apply pressure again, this time for 30 minutes.  Should your nose continue to bleed after this time, return to the ER for further treatment.  EPISTAXIS - WITHOUT PACKING  EPISTAXIS: You have been seen for Epistaxis (bloody nose).  Epistaxis in the medical term for a bloody nose. Epistaxis is a common condition and although frightening, it seldom becomes serious. The skin in the front of the nose is very fragile and in children is most often damaged by direct trauma (such as nose-picking) or when the skin becomes very dry or irritated (such as in dry environments on when you have a cold).  The bleeding often begins at the end of the nose, so pinching the entire nose for 15 minutes is often effective at stopping the bleeding.  When the bleeding area can be found, the doctor may will use a chemical called Silver Nitrate to stop the bleeding.  Other chemicals are sometimes used to stop the bleeding by causing the oozing blood to clot.  You may have had either or both of these treatments done today.  If the bleeding does not stop with chemical treatment, packing may be placed into the nose.  Although packing is sometimes uncomfortable, it is usually very effective at stopping a bloody nose.  Sometimes the bleeding starts from a blood vessel way in the back of the nose or in the throat. This type of bleeding is difficult to control and often requires nasal packing by an Ear, Nose and Throat specialist.  Applying ice to the forehead or back of the neck is not effective and WILL NOT stop the bleeding. Instead, press or squeeze the nose directly.  If the bleeding doesnt stop in 15 minutes, return here or go to the nearest Emergency  Department.  Avoid aspirin and non-steroidal anti-inflammatory medications (Advil, Motrin, Ibuprofen, Aleve, Naprosyn, etc.) and alcohol. These medications will make it difficult for your blood to clot.  Avoid blowing your nose, sneezing and straining for the next several days.  YOU SHOULD SEEK MEDICAL ATTENTION IMMEDIATELY, EITHER HERE OR AT THE NEAREST EMERGENCY DEPARTMENT, IF ANY OF THE FOLLOWING OCCURS:      You are unable to stop the bleeding with direct pressure.     You experience bleeding down the back of the throat.     The packing gets out of place.  If you develop symptoms of Shortness of Breath, Chest Pain, Swelling of lips, mouth or tongue or if your condition becomes worse with any new symptoms, see your doctor or return to the Emergency Department for immediate care. Emergency services are not intended to be a substitute for comprehensive medical attention.  Please contact your doctor for follow up if not improving as expected.   Call your doctor in 5-7 days or as directed if there is no improvement.    Peds NVD  Give small amount of fluids, juice water or pedialyte.  If child tolerates the small amount (think about a shot glass amount) you can contine and try to advance to a bland diet.  If child has vomiting, wait 30 minutes and start again.  Watch for signs of dehydration:  sunken eyes, dry lips or tongue, and decreased urination.  Return  to the ER for worsening condition or new cocerning symptoms.  See your pediatrician in 2-3 days for recheck.

## 2019-11-25 NOTE — ED Notes (Signed)
Patient ambulatory to restroom with no assistance and steady gait. Patient nose is still bleeding, but less than before. Patient states she does not feel it running down her throat any longer.

## 2019-11-25 NOTE — ED Notes (Signed)
Patient ambulated with steady gait and no assistance around department with no reoccurrence of bleeding.

## 2019-11-25 NOTE — ED Notes (Signed)
Patient states that she coughed up a clot and her nose has started bleeding again. Small clot noted on towel. Patient provided clean towel.

## 2019-11-25 NOTE — ED Triage Notes (Signed)
Pt reports that she [place] the gas station earlier. Pt was at home and felt something in the back of her throat and was a big blood clot and nose has been bleeding for the past hour constantly. Denies taking blood thinners.

## 2021-06-30 DIAGNOSIS — H259 Unspecified age-related cataract: Secondary | ICD-10-CM | POA: Diagnosis not present

## 2021-07-20 DIAGNOSIS — H25812 Combined forms of age-related cataract, left eye: Secondary | ICD-10-CM | POA: Diagnosis not present

## 2021-07-20 DIAGNOSIS — Z01818 Encounter for other preprocedural examination: Secondary | ICD-10-CM | POA: Diagnosis not present

## 2021-07-20 DIAGNOSIS — H25811 Combined forms of age-related cataract, right eye: Secondary | ICD-10-CM | POA: Diagnosis not present

## 2021-08-05 DIAGNOSIS — H25811 Combined forms of age-related cataract, right eye: Secondary | ICD-10-CM | POA: Diagnosis not present

## 2021-08-05 DIAGNOSIS — H2511 Age-related nuclear cataract, right eye: Secondary | ICD-10-CM | POA: Diagnosis not present

## 2021-08-19 DIAGNOSIS — H25812 Combined forms of age-related cataract, left eye: Secondary | ICD-10-CM | POA: Diagnosis not present

## 2021-08-19 DIAGNOSIS — H2512 Age-related nuclear cataract, left eye: Secondary | ICD-10-CM | POA: Diagnosis not present

## 2021-11-06 DIAGNOSIS — J018 Other acute sinusitis: Secondary | ICD-10-CM | POA: Diagnosis not present

## 2023-12-15 ENCOUNTER — Emergency Department (HOSPITAL_COMMUNITY): Payer: Medicare Other

## 2023-12-15 ENCOUNTER — Emergency Department (HOSPITAL_COMMUNITY)
Admission: EM | Admit: 2023-12-15 | Discharge: 2023-12-15 | Disposition: A | Discharge status: Home / Self Care | Payer: Medicare Other | Source: Home / Self Care | Attending: Emergency Medicine | Admitting: Emergency Medicine

## 2023-12-15 ENCOUNTER — Other Ambulatory Visit: Payer: Self-pay

## 2023-12-15 ENCOUNTER — Encounter (HOSPITAL_COMMUNITY): Payer: Self-pay

## 2023-12-15 DIAGNOSIS — Z1152 Encounter for screening for COVID-19: Secondary | ICD-10-CM | POA: Insufficient documentation

## 2023-12-15 DIAGNOSIS — J432 Centrilobular emphysema: Secondary | ICD-10-CM | POA: Diagnosis not present

## 2023-12-15 DIAGNOSIS — E876 Hypokalemia: Secondary | ICD-10-CM | POA: Insufficient documentation

## 2023-12-15 DIAGNOSIS — R069 Unspecified abnormalities of breathing: Secondary | ICD-10-CM | POA: Diagnosis not present

## 2023-12-15 DIAGNOSIS — Z7952 Long term (current) use of systemic steroids: Secondary | ICD-10-CM | POA: Diagnosis not present

## 2023-12-15 DIAGNOSIS — Z9049 Acquired absence of other specified parts of digestive tract: Secondary | ICD-10-CM | POA: Diagnosis not present

## 2023-12-15 DIAGNOSIS — Z7983 Long term (current) use of bisphosphonates: Secondary | ICD-10-CM | POA: Diagnosis not present

## 2023-12-15 DIAGNOSIS — R000 Tachycardia, unspecified: Secondary | ICD-10-CM | POA: Diagnosis not present

## 2023-12-15 DIAGNOSIS — J9601 Acute respiratory failure with hypoxia: Secondary | ICD-10-CM | POA: Diagnosis not present

## 2023-12-15 DIAGNOSIS — J441 Chronic obstructive pulmonary disease with (acute) exacerbation: Secondary | ICD-10-CM | POA: Diagnosis not present

## 2023-12-15 DIAGNOSIS — J101 Influenza due to other identified influenza virus with other respiratory manifestations: Secondary | ICD-10-CM | POA: Insufficient documentation

## 2023-12-15 DIAGNOSIS — Z79899 Other long term (current) drug therapy: Secondary | ICD-10-CM | POA: Diagnosis not present

## 2023-12-15 DIAGNOSIS — R197 Diarrhea, unspecified: Secondary | ICD-10-CM | POA: Diagnosis not present

## 2023-12-15 DIAGNOSIS — R739 Hyperglycemia, unspecified: Secondary | ICD-10-CM | POA: Insufficient documentation

## 2023-12-15 DIAGNOSIS — R636 Underweight: Secondary | ICD-10-CM | POA: Diagnosis not present

## 2023-12-15 DIAGNOSIS — R0682 Tachypnea, not elsewhere classified: Secondary | ICD-10-CM | POA: Diagnosis not present

## 2023-12-15 DIAGNOSIS — E878 Other disorders of electrolyte and fluid balance, not elsewhere classified: Secondary | ICD-10-CM | POA: Insufficient documentation

## 2023-12-15 DIAGNOSIS — R0602 Shortness of breath: Secondary | ICD-10-CM | POA: Diagnosis not present

## 2023-12-15 DIAGNOSIS — E871 Hypo-osmolality and hyponatremia: Secondary | ICD-10-CM | POA: Diagnosis not present

## 2023-12-15 DIAGNOSIS — R911 Solitary pulmonary nodule: Secondary | ICD-10-CM | POA: Diagnosis not present

## 2023-12-15 DIAGNOSIS — F17200 Nicotine dependence, unspecified, uncomplicated: Secondary | ICD-10-CM | POA: Diagnosis not present

## 2023-12-15 DIAGNOSIS — Z681 Body mass index (BMI) 19 or less, adult: Secondary | ICD-10-CM | POA: Diagnosis not present

## 2023-12-15 DIAGNOSIS — R0902 Hypoxemia: Secondary | ICD-10-CM | POA: Diagnosis not present

## 2023-12-15 DIAGNOSIS — R509 Fever, unspecified: Secondary | ICD-10-CM | POA: Diagnosis not present

## 2023-12-15 DIAGNOSIS — R64 Cachexia: Secondary | ICD-10-CM | POA: Diagnosis not present

## 2023-12-15 DIAGNOSIS — R531 Weakness: Secondary | ICD-10-CM | POA: Diagnosis not present

## 2023-12-15 DIAGNOSIS — R4702 Dysphasia: Secondary | ICD-10-CM | POA: Diagnosis not present

## 2023-12-15 DIAGNOSIS — Z8249 Family history of ischemic heart disease and other diseases of the circulatory system: Secondary | ICD-10-CM | POA: Diagnosis not present

## 2023-12-15 DIAGNOSIS — I2510 Atherosclerotic heart disease of native coronary artery without angina pectoris: Secondary | ICD-10-CM | POA: Diagnosis not present

## 2023-12-15 DIAGNOSIS — R062 Wheezing: Secondary | ICD-10-CM | POA: Diagnosis not present

## 2023-12-15 DIAGNOSIS — R918 Other nonspecific abnormal finding of lung field: Secondary | ICD-10-CM | POA: Diagnosis not present

## 2023-12-15 LAB — BASIC METABOLIC PANEL
Anion gap: 15 (ref 5–15)
BUN: 10 mg/dL (ref 8–23)
CO2: 23 mmol/L (ref 22–32)
Calcium: 8.3 mg/dL — ABNORMAL LOW (ref 8.9–10.3)
Chloride: 95 mmol/L — ABNORMAL LOW (ref 98–111)
Creatinine, Ser: 0.84 mg/dL (ref 0.44–1.00)
GFR, Estimated: >60 mL/min (ref >60)
Glucose, Bld: 249 mg/dL — ABNORMAL HIGH (ref 70–99)
Potassium: 2.7 mmol/L — CL (ref 3.5–5.1)
Sodium: 133 mmol/L — ABNORMAL LOW (ref 135–145)

## 2023-12-15 LAB — CBC WITH DIFFERENTIAL/PLATELET
Abs Immature Granulocytes: 0.05 10*3/uL (ref 0.00–0.07)
Basophils Absolute: 0 10*3/uL (ref 0.0–0.1)
Basophils Relative: 0 %
Eosinophils Absolute: 0 10*3/uL (ref 0.0–0.5)
Eosinophils Relative: 0 %
HCT: 38.8 % (ref 36.0–46.0)
Hemoglobin: 12.8 g/dL (ref 12.0–15.0)
Immature Granulocytes: 1 %
Lymphocytes Relative: 2 %
Lymphs Abs: 0.2 10*3/uL — ABNORMAL LOW (ref 0.7–4.0)
MCH: 31.5 pg (ref 26.0–34.0)
MCHC: 33 g/dL (ref 30.0–36.0)
MCV: 95.6 fL (ref 80.0–100.0)
Monocytes Absolute: 0.4 10*3/uL (ref 0.1–1.0)
Monocytes Relative: 5 %
Neutro Abs: 9.1 10*3/uL — ABNORMAL HIGH (ref 1.7–7.7)
Neutrophils Relative %: 92 %
Platelets: 217 10*3/uL (ref 150–400)
RBC: 4.06 MIL/uL (ref 3.87–5.11)
RDW: 13.2 % (ref 11.5–15.5)
WBC: 9.8 10*3/uL (ref 4.0–10.5)
nRBC: 0 % (ref 0.0–0.2)

## 2023-12-15 LAB — RESP PANEL BY RT-PCR (RSV, FLU A&B, COVID)  RVPGX2
Influenza A by PCR: POSITIVE — AB
Influenza B by PCR: NEGATIVE
Resp Syncytial Virus by PCR: NEGATIVE
SARS Coronavirus 2 by RT PCR: NEGATIVE

## 2023-12-15 LAB — BRAIN NATRIURETIC PEPTIDE: B Natriuretic Peptide: 58.7 pg/mL (ref 0.0–100.0)

## 2023-12-15 MED ORDER — POTASSIUM CHLORIDE CRYS ER 20 MEQ PO TBCR: 20.0000 meq | EXTENDED_RELEASE_TABLET | Freq: Two times a day (BID) | ORAL | 0 refills | Status: DC

## 2023-12-15 MED ORDER — MAGNESIUM OXIDE -MG SUPPLEMENT 400 (240 MG) MG PO TABS
400.0000 mg | ORAL_TABLET | Freq: Once | ORAL | Status: AC
  Administered 2023-12-15: 400 mg via ORAL
  Filled 2023-12-15: qty 1

## 2023-12-15 MED ORDER — SODIUM CHLORIDE (PF) 0.9 % IJ SOLN
INTRAMUSCULAR | Status: AC
  Filled 2023-12-15: qty 50

## 2023-12-15 MED ORDER — OSELTAMIVIR PHOSPHATE 75 MG PO CAPS: 75.0000 mg | ORAL_CAPSULE | Freq: Two times a day (BID) | ORAL | 0 refills | Status: DC

## 2023-12-15 MED ORDER — MAGNESIUM OXIDE 400 MG PO CAPS: 400.0000 mg | ORAL_CAPSULE | Freq: Every day | ORAL | 0 refills | Status: AC

## 2023-12-15 MED ORDER — POTASSIUM CHLORIDE 10 MEQ/100ML IV SOLN: 10.0000 meq | Freq: Once | INTRAVENOUS | Status: DC

## 2023-12-15 MED ORDER — PREDNISONE 20 MG PO TABS: 40.0000 mg | ORAL_TABLET | Freq: Every day | ORAL | 0 refills | Status: DC

## 2023-12-15 MED ORDER — IOHEXOL 350 MG/ML SOLN
75.0000 mL | Freq: Once | INTRAVENOUS | Status: AC | PRN
  Administered 2023-12-15: 75 mL via INTRAVENOUS

## 2023-12-15 MED ORDER — POTASSIUM CHLORIDE CRYS ER 20 MEQ PO TBCR
40.0000 meq | EXTENDED_RELEASE_TABLET | Freq: Once | ORAL | Status: AC
  Administered 2023-12-15: 40 meq via ORAL
  Filled 2023-12-15: qty 2

## 2023-12-15 MED ORDER — IPRATROPIUM-ALBUTEROL 0.5-2.5 (3) MG/3ML IN SOLN
3.0000 mL | Freq: Once | RESPIRATORY_TRACT | Status: AC
  Administered 2023-12-15: 3 mL via RESPIRATORY_TRACT
  Filled 2023-12-15: qty 3

## 2023-12-15 MED ORDER — ALBUTEROL SULFATE HFA 108 (90 BASE) MCG/ACT IN AERS: 1.0000 | INHALATION_SPRAY | Freq: Four times a day (QID) | RESPIRATORY_TRACT | 0 refills | Status: DC | PRN

## 2023-12-15 NOTE — ED Triage Notes (Addendum)
Pt coming from home. Pt began having flu like S/S yesterday fever, fatigue, exertional SOB. Pt was walking her dog this morning and was unable to catch her breath after. Pt has no hx of COPD but life long smoker. RA saturation of 88%, pt given 5mg  albuterol, 125mg  of solumedrol, and a duoneb treatment.

## 2023-12-15 NOTE — ED Provider Notes (Signed)
[facility] EMERGENCY DEPARTMENT AT [facility] Provider Note   CSN: [id number] Arrival date & time: 12/15/23  1031     History  Chief Complaint  Patient presents with   Shortness of Breath         Erika Buck is a 77 y.o. female.  Presenting to the ED for evaluation of shortness of breath.  She developed some congestion, rhinorrhea, fatigue, exertional shortness of breath, fever yesterday.  She was walking her dog this morning when the shortness of breath got significantly worse.  She has a chronic cough.  This is mild.  No recent change.  Nonproductive.  She denies chest pain.  She is a chronic everyday smoker.  Denies any history of COPD but states she has never been seen for this.  She denies any fever today.  She denies any recent cancer treatments, surgeries, long distance travel, unilateral calf pain or swelling, history of DVT or PE, estrogen use.  EMS reports oxygen saturation of 88% on room air on arrival.  She was given a DuoNeb, 125 mg Solu-Medrol, 5 mg albuterol nebulizer en route to the ER.  She reported significant improvement in her symptoms after this.   Shortness of Breath Associated symptoms: cough        Home Medications Prior to Admission medications   Medication Sig Start Date End Date Taking? Authorizing Provider  albuterol (VENTOLIN HFA) 108 (90 Base) MCG/ACT inhaler Inhale 1-2 puffs into the lungs every 6 (six) hours as needed for wheezing or shortness of breath. 12/15/23  Yes Alyus Mofield, Edsel Petrin, PA-C  Magnesium Oxide 400 MG CAPS Take 1 capsule (400 mg total) by mouth daily for 5 days. 12/15/23 12/20/23 Yes Clarine Elrod, Edsel Petrin, PA-C  oseltamivir (TAMIFLU) 75 MG capsule Take 1 capsule (75 mg total) by mouth every 12 (twelve) hours. 12/15/23  Yes Elijio Staples, Edsel Petrin, PA-C  potassium chloride SA (KLOR-CON M) 20 MEQ tablet Take 1 tablet (20 mEq total) by mouth 2 (two) times daily for 5 days. 12/15/23 12/20/23 Yes Ashle Stief, Edsel Petrin, PA-C  predniSONE  (DELTASONE) 20 MG tablet Take 2 tablets (40 mg total) by mouth daily with breakfast for 5 days. 12/15/23 12/20/23 Yes Xavion Muscat, Edsel Petrin, PA-C  Alendronate Sodium (FOSAMAX PO) Take by mouth once a week.      [provider]      Allergies    Patient has no known allergies.    Review of Systems   Review of Systems  Respiratory:  Positive for cough and shortness of breath.   All other systems reviewed and are negative.   Physical Exam Updated Vital Signs BP 127/72   Pulse (!) 123   Temp (!) 96.8 F (36 C) (Axillary)   Resp (!) 21   SpO2 97%  Physical Exam Vitals and nursing note reviewed.  Constitutional:      General: She is not in acute distress.    Appearance: She is well-developed.     Comments: Cachectic, resting comfortably in bed  HENT:     Head: Normocephalic and atraumatic.  Eyes:     Conjunctiva/sclera: Conjunctivae normal.  Cardiovascular:     Rate and Rhythm: Regular rhythm. Tachycardia present.     Heart sounds: No murmur heard. Pulmonary:     Effort: No respiratory distress.     Breath sounds: Decreased breath sounds present. No wheezing, rhonchi or rales.     Comments: Slight increase in effort.  Able to talk in full sentences. Abdominal:  Palpations: Abdomen is soft.     Tenderness: There is no abdominal tenderness.  Musculoskeletal:        General: No swelling.     Cervical back: Neck supple.  Skin:    General: Skin is warm and dry.     Capillary Refill: Capillary refill takes less than 2 seconds.  Neurological:     Mental Status: She is alert.  Psychiatric:        Mood and Affect: Mood normal.     ED Results / Procedures / Treatments   Labs (all labs ordered are listed, but only abnormal results are displayed) Labs Reviewed  RESP PANEL BY RT-PCR (RSV, FLU A&B, COVID)  RVPGX2 - Abnormal; Notable for the following components:      Result Value   Influenza A by PCR POSITIVE (*)    All other components within normal limits  BASIC  METABOLIC PANEL - Abnormal; Notable for the following components:   Sodium 133 (*)    Potassium 2.7 (*)    Chloride 95 (*)    Glucose, Bld 249 (*)    Calcium 8.3 (*)    All other components within normal limits  CBC WITH DIFFERENTIAL/PLATELET - Abnormal; Notable for the following components:   Neutro Abs 9.1 (*)    Lymphs Abs 0.2 (*)    All other components within normal limits  BRAIN NATRIURETIC PEPTIDE  MAGNESIUM    EKG EKG Interpretation Date/Time:  Friday December 15 2023 10:39:31 EST Ventricular Rate:  118 PR Interval:  158 QRS Duration:  82 QT Interval:  326 QTC Calculation: 457 R Axis:   113  Text Interpretation: Sinus tachycardia Ventricular tachycardia, unsustained Consider right atrial enlargement Probable RVH w/ secondary repol abnormality Confirmed by Pricilla Loveless [phone]) on 12/15/2023 10:41:39 AM  Radiology CT Angio Chest PE W/Cm &/Or Wo Cm Result Date: 12/15/2023 CLINICAL DATA:  Sudden onset shortness of breath this morning. EXAM: CT ANGIOGRAPHY CHEST WITH CONTRAST TECHNIQUE: Multidetector CT imaging of the chest was performed using the standard protocol during bolus administration of intravenous contrast. Multiplanar CT image reconstructions and MIPs were obtained to evaluate the vascular anatomy. RADIATION DOSE REDUCTION: This exam was performed according to the departmental dose-optimization program which includes automated exposure control, adjustment of the mA and/or kV according to patient size and/or use of iterative reconstruction technique. CONTRAST:  75mL OMNIPAQUE IOHEXOL 350 MG/ML SOLN COMPARISON:  Chest x-ray from same day. FINDINGS: Cardiovascular: Satisfactory opacification of the pulmonary arteries to the segmental level. No evidence of pulmonary embolism. Normal heart size. No pericardial effusion. No thoracic aortic aneurysm or dissection. Coronary, aortic arch, and branch vessel atherosclerotic vascular disease. Mediastinum/Nodes: No enlarged  mediastinal, hilar, or axillary lymph nodes. Thyroid gland, trachea, and esophagus demonstrate no significant findings. Lungs/Pleura: Mild centrilobular emphysema. Scattered small airways thickening in mucous impaction. No focal consolidation, pleural effusion, or pneumothorax. 4 mm solid pulmonary nodule in the medial left upper lobe (series 14, 87). Upper Abdomen: No acute abnormality. Musculoskeletal: No chest wall abnormality. No acute or significant osseous findings. Review of the MIP images confirms the above findings. IMPRESSION: 1. No evidence of pulmonary embolism. No acute intrathoracic process. 2. 4 mm solid pulmonary nodule in the medial left upper lobe. Per Fleischner Society Guidelines,if patient is low risk for malignancy, no routine follow-up imaging is recommended. If patient is high risk for malignancy, a non-contrast Chest CT at 12 months is optional. If performed and the nodule is stable at 12 months, no further follow-up is recommended.  These guidelines do not apply to immunocompromised patients and patients with cancer. Follow up in patients with significant comorbidities as clinically warranted. For lung cancer screening, adhere to Lung-RADS guidelines. Reference: Radiology. 2017; 284(1):228-43. 3. Aortic Atherosclerosis (ICD10-I70.0) and Emphysema (ICD10-J43.9). Electronically Signed   By: Obie Dredge M.D.   On: 12/15/2023 14:06   DG Chest Port 1 View Result Date: 12/15/2023 CLINICAL DATA:  Fever, flu like symptoms EXAM: PORTABLE CHEST 1 VIEW COMPARISON:  None Available. FINDINGS: Normal mediastinum and cardiac silhouette. Lungs are hyperinflated. Normal pulmonary vasculature. No evidence of effusion, infiltrate, or pneumothorax. No acute bony abnormality. IMPRESSION: 1. No evidence of pneumonia. 2. Hyperinflation suggests COPD. Electronically Signed   By: Genevive Bi M.D.   On: 12/15/2023 12:37    Procedures Procedures    Medications Ordered in ED Medications   ipratropium-albuterol (DUONEB) 0.5-2.5 (3) MG/3ML nebulizer solution 3 mL (3 mLs Nebulization Given 12/15/23 1156)  potassium chloride SA (KLOR-CON M) CR tablet 40 mEq (40 mEq Oral Given 12/15/23 1448)  iohexol (OMNIPAQUE) 350 MG/ML injection 75 mL (75 mLs Intravenous Contrast Given 12/15/23 1350)  magnesium oxide (MAG-OX) tablet 400 mg (400 mg Oral Given 12/15/23 1448)    ED Course/ Medical Decision Making/ A&P                                 Medical Decision Making Amount and/or Complexity of Data Reviewed Labs: ordered. Radiology: ordered.  Risk Prescription drug management.  This patient presents to the ED for concern of shortness of breath, this involves an extensive number of treatment options, and is a complaint that carries with it a high risk of complications and morbidity. The emergent differential diagnosis for shortness of breath includes, but is not limited to, Pulmonary edema, bronchoconstriction, Pneumonia, Pulmonary embolism, Pneumotherax/ Hemothorax, Dysrythmia, ACS.    My initial workup includes labs, EKG, imaging, symptom control  Additional history obtained from: Nursing notes from this visit. Family daughter at bedside provides a portion of the history  I ordered, reviewed and interpreted labs which include: CBC, BMP, BNP, respiratory panel.  Positive for influenza A.  Hyponatremia of 133, hypokalemia of 2.7, hypochloremia of 95, hyperglycemia of 249.  No leukocytosis or anemia.  I ordered imaging studies including chest x-ray, CT PE study I independently visualized and interpreted imaging which showed hyperinflated chest x-ray concerning for COPD.  CT PE study negative for PE but does show an incidental finding of lung nodule with recommendation for repeat imaging in 12 months I agree with the radiologist interpretation  Cardiac Monitoring:  The patient was maintained on a cardiac monitor.  I personally viewed and interpreted the cardiac monitored which  showed an underlying rhythm of: Sinus tachycardia  Afebrile, tachycardic and mildly tachypneic but otherwise hemodynamically stable.  77 year old female presenting for shortness of breath and upper respiratory infection symptoms.  Symptoms began yesterday.  She is a chronic smoker.  She does not follow with primary care.  Chest x-ray with hyperinflation concerning for COPD.  Patient has a chronic cough and is a chronic smoker but has never been formally diagnosed with COPD.  She was reportedly hypoxic to 88% on room air by EMS.  She has been 93 to 94% on room air consistently here in the emergency department.  She reports significant improvement after treatment in the emergency department.  Lab workup concerning for hypokalemia with a potassium of 2.7 which is likely due to the amount  of albuterol she received.  This was replenished here in the emergency department.  CT PE study negative for PE but does have incidental finding of lung nodule.  This was discussed with patient.  She is to follow-up in 1 year for repeat imaging.  Patient was positive for influenza A.  This is likely the cause of her symptoms.  I discussed options with the patient.  She strongly prefers discharge home and declines admission.  I discussed the need for close monitoring and very strict return precautions.  She verbalizes understanding.  She is in agreement.  Tamiflu will be sent as her symptoms began approximately 24 hours ago.  Stable at discharge.  At this time there does not appear to be any evidence of an acute emergency medical condition and the patient appears stable for discharge with appropriate outpatient follow up. Diagnosis was discussed with patient who verbalizes understanding of care plan and is agreeable to discharge. I have discussed return precautions with patient who verbalizes understanding. Patient encouraged to follow-up with their PCP within 1 week. All questions answered.  Patient's case discussed with Dr.  Criss Alvine who agrees with plan to discharge with follow-up.   Note: Portions of this report may have been transcribed using voice recognition software. Every effort was made to ensure accuracy; however, inadvertent computerized transcription errors may still be present.         Final Clinical Impression(s) / ED Diagnoses Final diagnoses:  Influenza A  Hypokalemia    Rx / DC Orders ED Discharge Orders          Ordered    oseltamivir (TAMIFLU) 75 MG capsule  Every 12 hours        12/15/23 1429    potassium chloride SA (KLOR-CON M) 20 MEQ tablet  2 times daily        12/15/23 1429    Magnesium Oxide 400 MG CAPS  Daily        12/15/23 1429    albuterol (VENTOLIN HFA) 108 (90 Base) MCG/ACT inhaler  Every 6 hours PRN        12/15/23 1429    predniSONE (DELTASONE) 20 MG tablet  Daily with breakfast        12/15/23 1429              Michelle Piper, PA-C 12/15/23 1503    Pricilla Loveless, MD 12/18/23 [phone]

## 2023-12-15 NOTE — Discharge Instructions (Addendum)
You have been seen today for your complaint of shortness of breath. Your lab work was positive for influenza A.  Also showed a low potassium level. Your imaging showed an incidental finding of a lung nodule.  You should follow-up in 1 year regarding this for repeat imaging. Your discharge medications include Tamiflu.  Take this as prescribed. Potassium and magnesium.  Take these medications as prescribed. Prednisone.  This is a steroid.  Take it as prescribed and for the entire duration of the prescription. Albuterol.  Take this as needed for shortness of breath. Follow up with: Your primary care provider in 1 week for reevaluation Please seek immediate medical care if you develop any of the following symptoms: You become short of breath or have trouble breathing. Your skin or nails turn blue. You have very bad pain or stiffness in your neck. You get a sudden headache or pain in your face or ear. You vomit each time you eat or drink. At this time there does not appear to be the presence of an emergent medical condition, however there is always the potential for conditions to change. Please read and follow the below instructions.  Do not take your medicine if  develop an itchy rash, swelling in your mouth or lips, or difficulty breathing; call 911 and seek immediate emergency medical attention if this occurs.  You may review your lab tests and imaging results in their entirety on your MyChart account.  Please discuss all results of fully with your primary care provider and other specialist at your follow-up visit.  Note: Portions of this text may have been transcribed using voice recognition software. Every effort was made to ensure accuracy; however, inadvertent computerized transcription errors may still be present.

## 2023-12-15 NOTE — ED Provider Triage Note (Signed)
Emergency Medicine Provider Triage Evaluation Note  Erika Buck , a 77 y.o. female  was evaluated in triage.  Pt complains of shortness of breath.  Sudden onset this morning.  Has history of COPD.  No chest pain or fevers.  Does have some congestion and rhinorrhea..  Review of Systems  Positive: As above Negative: As above  Physical Exam  Pulse (!) 123   Temp 98 F (36.7 C) (Oral)   Resp (!) 24   SpO2 97%  Gen:   Awake, no distress   Resp:  Slightly increased effort.  On nebulizer MSK:   Moves extremities without difficulty  Other:    Medical Decision Making  Medically screening exam initiated at 11:00 AM.  Appropriate orders placed.  Erika Buck was informed that the remainder of the evaluation will be completed by another provider, this initial triage assessment does not replace that evaluation, and the importance of remaining in the ED until their evaluation is complete.  Workup initiated   Michelle Piper, Cordelia Poche 12/15/23 1100

## 2023-12-16 ENCOUNTER — Encounter (HOSPITAL_COMMUNITY): Payer: Self-pay | Admitting: Internal Medicine

## 2023-12-16 ENCOUNTER — Inpatient Hospital Stay (HOSPITAL_COMMUNITY)
Admission: EM | Admit: 2023-12-16 | Discharge: 2023-12-19 | DRG: 193 | Disposition: A | Discharge status: Home / Self Care | Payer: Medicare Other | Attending: Internal Medicine | Admitting: Internal Medicine

## 2023-12-16 ENCOUNTER — Other Ambulatory Visit: Payer: Self-pay

## 2023-12-16 DIAGNOSIS — Z8249 Family history of ischemic heart disease and other diseases of the circulatory system: Secondary | ICD-10-CM

## 2023-12-16 DIAGNOSIS — Z7983 Long term (current) use of bisphosphonates: Secondary | ICD-10-CM

## 2023-12-16 DIAGNOSIS — Z7952 Long term (current) use of systemic steroids: Secondary | ICD-10-CM

## 2023-12-16 DIAGNOSIS — Z9049 Acquired absence of other specified parts of digestive tract: Secondary | ICD-10-CM

## 2023-12-16 DIAGNOSIS — Z79899 Other long term (current) drug therapy: Secondary | ICD-10-CM

## 2023-12-16 DIAGNOSIS — Z681 Body mass index (BMI) 19 or less, adult: Secondary | ICD-10-CM

## 2023-12-16 DIAGNOSIS — F17200 Nicotine dependence, unspecified, uncomplicated: Secondary | ICD-10-CM | POA: Diagnosis present

## 2023-12-16 DIAGNOSIS — J441 Chronic obstructive pulmonary disease with (acute) exacerbation: Secondary | ICD-10-CM | POA: Diagnosis present

## 2023-12-16 DIAGNOSIS — J9601 Acute respiratory failure with hypoxia: Secondary | ICD-10-CM | POA: Diagnosis not present

## 2023-12-16 DIAGNOSIS — R636 Underweight: Secondary | ICD-10-CM | POA: Diagnosis present

## 2023-12-16 DIAGNOSIS — R911 Solitary pulmonary nodule: Secondary | ICD-10-CM | POA: Diagnosis present

## 2023-12-16 DIAGNOSIS — Z1152 Encounter for screening for COVID-19: Secondary | ICD-10-CM

## 2023-12-16 DIAGNOSIS — E871 Hypo-osmolality and hyponatremia: Secondary | ICD-10-CM | POA: Diagnosis present

## 2023-12-16 DIAGNOSIS — J101 Influenza due to other identified influenza virus with other respiratory manifestations: Secondary | ICD-10-CM | POA: Diagnosis not present

## 2023-12-16 DIAGNOSIS — R64 Cachexia: Secondary | ICD-10-CM | POA: Diagnosis present

## 2023-12-16 DIAGNOSIS — R197 Diarrhea, unspecified: Secondary | ICD-10-CM | POA: Diagnosis present

## 2023-12-16 DIAGNOSIS — J111 Influenza due to unidentified influenza virus with other respiratory manifestations: Principal | ICD-10-CM

## 2023-12-16 DIAGNOSIS — E876 Hypokalemia: Secondary | ICD-10-CM | POA: Diagnosis present

## 2023-12-16 LAB — CBC WITH DIFFERENTIAL/PLATELET
Abs Immature Granulocytes: 0.07 10*3/uL (ref 0.00–0.07)
Basophils Absolute: 0 10*3/uL (ref 0.0–0.1)
Basophils Relative: 0 %
Eosinophils Absolute: 0 10*3/uL (ref 0.0–0.5)
Eosinophils Relative: 0 %
HCT: 40 % (ref 36.0–46.0)
Hemoglobin: 13.2 g/dL (ref 12.0–15.0)
Immature Granulocytes: 0 %
Lymphocytes Relative: 2 %
Lymphs Abs: 0.3 10*3/uL — ABNORMAL LOW (ref 0.7–4.0)
MCH: 31.5 pg (ref 26.0–34.0)
MCHC: 33 g/dL (ref 30.0–36.0)
MCV: 95.5 fL (ref 80.0–100.0)
Monocytes Absolute: 0.9 10*3/uL (ref 0.1–1.0)
Monocytes Relative: 6 %
Neutro Abs: 14.7 10*3/uL — ABNORMAL HIGH (ref 1.7–7.7)
Neutrophils Relative %: 92 %
Platelets: 239 10*3/uL (ref 150–400)
RBC: 4.19 MIL/uL (ref 3.87–5.11)
RDW: 13.5 % (ref 11.5–15.5)
WBC: 16 10*3/uL — ABNORMAL HIGH (ref 4.0–10.5)
nRBC: 0 % (ref 0.0–0.2)

## 2023-12-16 LAB — BASIC METABOLIC PANEL
Anion gap: 9 (ref 5–15)
BUN: 12 mg/dL (ref 8–23)
CO2: 24 mmol/L (ref 22–32)
Calcium: 8.3 mg/dL — ABNORMAL LOW (ref 8.9–10.3)
Chloride: 99 mmol/L (ref 98–111)
Creatinine, Ser: 0.65 mg/dL (ref 0.44–1.00)
GFR, Estimated: >60 mL/min (ref >60)
Glucose, Bld: 150 mg/dL — ABNORMAL HIGH (ref 70–99)
Potassium: 4 mmol/L (ref 3.5–5.1)
Sodium: 132 mmol/L — ABNORMAL LOW (ref 135–145)

## 2023-12-16 MED ORDER — OSELTAMIVIR PHOSPHATE 75 MG PO CAPS: 75.0000 mg | ORAL_CAPSULE | Freq: Two times a day (BID) | ORAL | Status: DC

## 2023-12-16 MED ORDER — IPRATROPIUM-ALBUTEROL 0.5-2.5 (3) MG/3ML IN SOLN
3.0000 mL | Freq: Four times a day (QID) | RESPIRATORY_TRACT | Status: DC
  Administered 2023-12-16 – 2023-12-17 (×4): 3 mL via RESPIRATORY_TRACT
  Filled 2023-12-16 (×4): qty 3

## 2023-12-16 MED ORDER — TRAZODONE HCL 50 MG PO TABS
25.0000 mg | ORAL_TABLET | Freq: Every evening | ORAL | Status: DC | PRN
Start: 2023-12-16 — End: 2023-12-19
  Filled 2023-12-16: qty 1

## 2023-12-16 MED ORDER — ONDANSETRON HCL 4 MG/2ML IJ SOLN: 4.0000 mg | Freq: Four times a day (QID) | INTRAMUSCULAR | Status: DC | PRN

## 2023-12-16 MED ORDER — OSELTAMIVIR PHOSPHATE 30 MG PO CAPS
30.0000 mg | ORAL_CAPSULE | Freq: Two times a day (BID) | ORAL | Status: DC
  Administered 2023-12-16 – 2023-12-19 (×6): 30 mg via ORAL
  Filled 2023-12-16 (×6): qty 1

## 2023-12-16 MED ORDER — ENOXAPARIN SODIUM 40 MG/0.4ML IJ SOSY
40.0000 mg | PREFILLED_SYRINGE | INTRAMUSCULAR | Status: DC
  Administered 2023-12-16 – 2023-12-18 (×3): 40 mg via SUBCUTANEOUS
  Filled 2023-12-16 (×3): qty 0.4

## 2023-12-16 MED ORDER — ALBUTEROL SULFATE (2.5 MG/3ML) 0.083% IN NEBU: 2.5000 mg | INHALATION_SOLUTION | RESPIRATORY_TRACT | Status: DC | PRN

## 2023-12-16 MED ORDER — ACETAMINOPHEN 650 MG RE SUPP: 650.0000 mg | Freq: Four times a day (QID) | RECTAL | Status: DC | PRN

## 2023-12-16 MED ORDER — PREDNISONE 20 MG PO TABS
40.0000 mg | ORAL_TABLET | Freq: Every day | ORAL | Status: DC
  Administered 2023-12-17 – 2023-12-19 (×3): 40 mg via ORAL
  Filled 2023-12-16 (×3): qty 2

## 2023-12-16 MED ORDER — IPRATROPIUM-ALBUTEROL 0.5-2.5 (3) MG/3ML IN SOLN: 3.0000 mL | Freq: Once | RESPIRATORY_TRACT | Status: DC

## 2023-12-16 MED ORDER — ONDANSETRON HCL 4 MG PO TABS: 4.0000 mg | ORAL_TABLET | Freq: Four times a day (QID) | ORAL | Status: DC | PRN

## 2023-12-16 MED ORDER — ACETAMINOPHEN 325 MG PO TABS: 650.0000 mg | ORAL_TABLET | Freq: Four times a day (QID) | ORAL | Status: DC | PRN

## 2023-12-16 NOTE — H&P (Signed)
History and Physical  Erika Buck ZOX:[id number] DOB: Jun 20, 1946 DOA: 12/16/2023  PCP: System, Provider Not In   Chief Complaint: Shortness of breath  HPI: Erika Buck is a 77 y.o. female active smoker who was diagnosed with influenza A in the emergency department yesterday, returns today being admitted with wheezing and dyspnea.  She was offered admission yesterday, but declined and felt well enough to go home.  States that when she went home, she took her prednisone and Tamiflu as prescribed, but last night and again this morning, she was feeling a lot of tightness in her chest, coughing but with no sputum production feeling like she could not catch her breath.  She was also having some subjective fevers overnight and not able to sleep well.  Decided to come to the ER for evaluation, here she had CT angio chest which ruled out PE but does show a 4 mm pulmonary nodule.  She is saturating 93% at rest, but getting very dyspneic with any kind of movement.  Review of Systems: Please see HPI for pertinent positives and negatives. A complete 10 system review of systems are otherwise negative.  Past Medical History:  Diagnosis Date   Wears dentures    Past Surgical History:  Procedure Laterality Date   CHOLECYSTECTOMY  2003   COLON SURGERY     PARTIAL COLECTOMY  09/16/2011   Laparoscopic assisted right   Social History:  reports that she has been smoking. She does not have any smokeless tobacco history on file. She reports that she does not drink alcohol and does not use drugs.  No Known Allergies  Family History  Problem Relation Age of Onset   Heart disease Mother    Cancer Brother        adrenal glands     Prior to Admission medications   Medication Sig Start Date End Date Taking? Authorizing Provider  aspirin 325 MG tablet Take 325 mg by mouth every 6 (six) hours as needed for moderate pain (pain score 4-6).   Yes [provider]  albuterol (VENTOLIN HFA) 108 (90 Base)  MCG/ACT inhaler Inhale 1-2 puffs into the lungs every 6 (six) hours as needed for wheezing or shortness of breath. 12/15/23  Yes Schutt, Edsel Petrin, PA-C  Magnesium Oxide 400 MG CAPS Take 1 capsule (400 mg total) by mouth daily for 5 days. Patient not taking: Reported on 12/16/2023 12/15/23 12/20/23  Michelle Piper, PA-C  oseltamivir (TAMIFLU) 75 MG capsule Take 1 capsule (75 mg total) by mouth every 12 (twelve) hours. 12/15/23  Yes Schutt, Edsel Petrin, PA-C  potassium chloride SA (KLOR-CON M) 20 MEQ tablet Take 1 tablet (20 mEq total) by mouth 2 (two) times daily for 5 days. Patient not taking: Reported on 12/16/2023 12/15/23 12/20/23  Michelle Piper, PA-C  predniSONE (DELTASONE) 20 MG tablet Take 2 tablets (40 mg total) by mouth daily with breakfast for 5 days. 12/15/23 12/20/23 Yes Schutt, Edsel Petrin, PA-C    Physical Exam: BP (!) 149/73   Pulse (!) 106   Temp 98.2 F (36.8 C) (Oral)   Resp 16   SpO2 92%  General:  Alert, oriented, calm, in no acute distress, her son and daughter are at the bedside.  He is speaking in full sentences, but getting visibly dyspneic with speaking.  No cough. Eyes: EOMI, clear conjuctivae, white sclerea Neck: supple, no masses, trachea mildline  Cardiovascular: RRR, no murmurs or rubs, no peripheral edema  Respiratory: She is barrel chested, breath  sounds are diminished globally, with some mild expiratory wheezing Abdomen: soft, nontender, nondistended, normal bowel tones heard  Skin: dry, no rashes  Musculoskeletal: no joint effusions, normal range of motion  Psychiatric: appropriate affect, normal speech  Neurologic: extraocular muscles intact, clear speech, moving all extremities with intact sensorium         Labs on Admission:  Basic Metabolic Panel: Recent Labs  Lab 12/15/23 1159  NA 133*  K 2.7*  CL 95*  CO2 23  GLUCOSE 249*  BUN 10  CREATININE 0.84  CALCIUM 8.3*   Liver Function Tests: No results for input(s): "AST", "ALT",  "ALKPHOS", "BILITOT", "PROT", "ALBUMIN" in the last 168 hours. No results for input(s): "LIPASE", "AMYLASE" in the last 168 hours. No results for input(s): "AMMONIA" in the last 168 hours. CBC: Recent Labs  Lab 12/15/23 1159  WBC 9.8  NEUTROABS 9.1*  HGB 12.8  HCT 38.8  MCV 95.6  PLT 217   Cardiac Enzymes: No results for input(s): "CKTOTAL", "CKMB", "CKMBINDEX", "TROPONINI" in the last 168 hours. BNP (last 3 results) Recent Labs    12/15/23 1159  BNP 58.7    ProBNP (last 3 results) No results for input(s): "PROBNP" in the last 8760 hours.  CBG: No results for input(s): "GLUCAP" in the last 168 hours.  Radiological Exams on Admission: CT Angio Chest PE W/Cm &/Or Wo Cm Result Date: 12/15/2023 CLINICAL DATA:  Sudden onset shortness of breath this morning. EXAM: CT ANGIOGRAPHY CHEST WITH CONTRAST TECHNIQUE: Multidetector CT imaging of the chest was performed using the standard protocol during bolus administration of intravenous contrast. Multiplanar CT image reconstructions and MIPs were obtained to evaluate the vascular anatomy. RADIATION DOSE REDUCTION: This exam was performed according to the departmental dose-optimization program which includes automated exposure control, adjustment of the mA and/or kV according to patient size and/or use of iterative reconstruction technique. CONTRAST:  75mL OMNIPAQUE IOHEXOL 350 MG/ML SOLN COMPARISON:  Chest x-ray from same day. FINDINGS: Cardiovascular: Satisfactory opacification of the pulmonary arteries to the segmental level. No evidence of pulmonary embolism. Normal heart size. No pericardial effusion. No thoracic aortic aneurysm or dissection. Coronary, aortic arch, and branch vessel atherosclerotic vascular disease. Mediastinum/Nodes: No enlarged mediastinal, hilar, or axillary lymph nodes. Thyroid gland, trachea, and esophagus demonstrate no significant findings. Lungs/Pleura: Mild centrilobular emphysema. Scattered small airways thickening  in mucous impaction. No focal consolidation, pleural effusion, or pneumothorax. 4 mm solid pulmonary nodule in the medial left upper lobe (series 14, 87). Upper Abdomen: No acute abnormality. Musculoskeletal: No chest wall abnormality. No acute or significant osseous findings. Review of the MIP images confirms the above findings. IMPRESSION: 1. No evidence of pulmonary embolism. No acute intrathoracic process. 2. 4 mm solid pulmonary nodule in the medial left upper lobe. Per Fleischner Society Guidelines,if patient is low risk for malignancy, no routine follow-up imaging is recommended. If patient is high risk for malignancy, a non-contrast Chest CT at 12 months is optional. If performed and the nodule is stable at 12 months, no further follow-up is recommended. These guidelines do not apply to immunocompromised patients and patients with cancer. Follow up in patients with significant comorbidities as clinically warranted. For lung cancer screening, adhere to Lung-RADS guidelines. Reference: Radiology. 2017; 284(1):228-43. 3. Aortic Atherosclerosis (ICD10-I70.0) and Emphysema (ICD10-J43.9). Electronically Signed   By: Obie Dredge M.D.   On: 12/15/2023 14:06   DG Chest Port 1 View Result Date: 12/15/2023 CLINICAL DATA:  Fever, flu like symptoms EXAM: PORTABLE CHEST 1 VIEW COMPARISON:  None Available.  FINDINGS: Normal mediastinum and cardiac silhouette. Lungs are hyperinflated. Normal pulmonary vasculature. No evidence of effusion, infiltrate, or pneumothorax. No acute bony abnormality. IMPRESSION: 1. No evidence of pneumonia. 2. Hyperinflation suggests COPD. Electronically Signed   By: Genevive Bi M.D.   On: 12/15/2023 12:37   Assessment/Plan Erika Buck is a 77 y.o. female active smoker who was diagnosed with influenza A in the emergency department yesterday, returns today being admitted with wheezing and dyspnea.  Acute exacerbation of presumed COPD-I suspect patient likely has  underlying/undiagnosed emphysema or COPD.  Now with worsening cough, dyspnea with exertion, unable to clear sputum.  Likely triggered by influenza infection. -Observation admission -Supplemental oxygen if needed, with goal O2 saturation greater than 90% -Scheduled DuoNebs -Continue oral prednisone (took her dose this morning at home) -Albuterol nebulizer as needed for cough, shortness of breath  Influenza A-continue Tamiflu and supportive care  Hypokalemia-seen on labs from 12/20, patient states she was having some diarrhea.  Repeat BMP pending.  Solitary pulmonary nodule-will need repeat noncontrast CT chest at 12 months.  Tobacco abuse-patient will need cessation counseling, nicotine patch will be offered  DVT prophylaxis: Lovenox     Code Status: Full Code  Consults called: None  Admission status: Observation  Time spent: 49 minutes  Danner Paulding Sharlette Dense MD [facility] Pager [phone]  If 7PM-7AM, please contact night-coverage [email or web address] Password [facility]  12/16/2023, 10:34 AM

## 2023-12-16 NOTE — ED Triage Notes (Signed)
"  Seen here yesterday and diagnosed with the flu, feeling worse today, wheezing, got 2.5mg  of albuterol, she had taken her prednisone at 0600 this morning" per EMS

## 2023-12-16 NOTE — Progress Notes (Signed)
PHARMACY NOTE:  ANTIMICROBIAL RENAL DOSAGE ADJUSTMENT  Current antimicrobial regimen includes a mismatch between antimicrobial dosage and estimated renal function.  As per policy approved by the Pharmacy & Therapeutics and Medical Executive Committees, the antimicrobial dosage will be adjusted accordingly.  Current antimicrobial dosage: Oseltamivir 75mg  PO BID  Indication: Influenza A  Renal Function:  Estimated Creatinine Clearance: 45 mL/min (by C-G formula based on SCr of 0.65 mg/dL). []      On intermittent HD, scheduled: []      On CRRT    Antimicrobial dosage has been changed to: Oseltamivir 30mg  PO BID   Thank you for allowing pharmacy to be a part of this patient's care.  Jamse Mead, [facility] 12/16/2023 3:39 PM

## 2023-12-16 NOTE — ED Notes (Addendum)
Pt O2 started at 91 and dropped down to 86 while ambulating, O2 went back up to 90 before laying back down.

## 2023-12-16 NOTE — ED Provider Notes (Signed)
[place] EMERGENCY DEPARTMENT AT [facility] Provider Note   CSN: [id number] Arrival date & time: 12/16/23  2595     History  Chief Complaint  Patient presents with   Influenza    Erika Buck is a 77 y.o. female history of cigarette use presented with influenza A.  Patient seen yesterday and had entire workup including labs and imaging and ultimately was diagnosed with influenza A.  Patient states that the provider wanted her to stay as she was satting around 9192% O2 on room air however patient wanted to leave.  Patient states that since going home she has felt progressively more short of breath and being coughing but otherwise denies any other changes in symptoms.  Patient feels that she needs to be admitted after trying and failing the outpatient therapy.  Patient has been taking the Tamiflu.  Patient denies chest pain, vomiting, fevers, nausea  Home Medications Prior to Admission medications   Medication Sig Start Date End Date Taking? Authorizing Provider  aspirin 325 MG tablet Take 325 mg by mouth every 6 (six) hours as needed for moderate pain (pain score 4-6).   Yes [provider]  albuterol (VENTOLIN HFA) 108 (90 Base) MCG/ACT inhaler Inhale 1-2 puffs into the lungs every 6 (six) hours as needed for wheezing or shortness of breath. 12/15/23  Yes Schutt, Edsel Petrin, PA-C  Magnesium Oxide 400 MG CAPS Take 1 capsule (400 mg total) by mouth daily for 5 days. Patient not taking: Reported on 12/16/2023 12/15/23 12/20/23  Michelle Piper, PA-C  oseltamivir (TAMIFLU) 75 MG capsule Take 1 capsule (75 mg total) by mouth every 12 (twelve) hours. 12/15/23  Yes Schutt, Edsel Petrin, PA-C  potassium chloride SA (KLOR-CON M) 20 MEQ tablet Take 1 tablet (20 mEq total) by mouth 2 (two) times daily for 5 days. Patient not taking: Reported on 12/16/2023 12/15/23 12/20/23  Michelle Piper, PA-C  predniSONE (DELTASONE) 20 MG tablet Take 2 tablets (40 mg total) by  mouth daily with breakfast for 5 days. 12/15/23 12/20/23 Yes Schutt, Edsel Petrin, PA-C      Allergies    Patient has no known allergies.    Review of Systems   Review of Systems  Physical Exam Updated Vital Signs BP (!) 149/73   Pulse (!) 106   Temp 98.2 F (36.8 C) (Oral)   Resp 16   SpO2 92%  Physical Exam Constitutional:      General: She is not in acute distress. Eyes:     Extraocular Movements: Extraocular movements intact.     Conjunctiva/sclera: Conjunctivae normal.     Pupils: Pupils are equal, round, and reactive to light.  Cardiovascular:     Rate and Rhythm: Regular rhythm. Tachycardia present.     Pulses: Normal pulses.     Heart sounds: Normal heart sounds.  Pulmonary:     Effort: Pulmonary effort is normal. No respiratory distress.     Breath sounds: Wheezing (Bilateral) and rhonchi (Right-sided) present.     Comments: Able to speak in full sentences Abdominal:     Palpations: Abdomen is soft.     Tenderness: There is no abdominal tenderness. There is no guarding or rebound.  Musculoskeletal:     Cervical back: Normal range of motion. No rigidity.     Right lower leg: No edema.     Left lower leg: No edema.     Comments: No calf tenderness  Skin:    General: Skin is warm and dry.  Neurological:     Mental Status: She is alert.  Psychiatric:        Mood and Affect: Mood normal.     ED Results / Procedures / Treatments   Labs (all labs ordered are listed, but only abnormal results are displayed) Labs Reviewed  CBC WITH DIFFERENTIAL/PLATELET - Abnormal; Notable for the following components:      Result Value   WBC 16.0 (*)    Neutro Abs 14.7 (*)    Lymphs Abs 0.3 (*)    All other components within normal limits  BASIC METABOLIC PANEL    EKG None  Radiology CT Angio Chest PE W/Cm &/Or Wo Cm Result Date: 12/15/2023 CLINICAL DATA:  Sudden onset shortness of breath this morning. EXAM: CT ANGIOGRAPHY CHEST WITH CONTRAST TECHNIQUE:  Multidetector CT imaging of the chest was performed using the standard protocol during bolus administration of intravenous contrast. Multiplanar CT image reconstructions and MIPs were obtained to evaluate the vascular anatomy. RADIATION DOSE REDUCTION: This exam was performed according to the departmental dose-optimization program which includes automated exposure control, adjustment of the mA and/or kV according to patient size and/or use of iterative reconstruction technique. CONTRAST:  75mL OMNIPAQUE IOHEXOL 350 MG/ML SOLN COMPARISON:  Chest x-ray from same day. FINDINGS: Cardiovascular: Satisfactory opacification of the pulmonary arteries to the segmental level. No evidence of pulmonary embolism. Normal heart size. No pericardial effusion. No thoracic aortic aneurysm or dissection. Coronary, aortic arch, and branch vessel atherosclerotic vascular disease. Mediastinum/Nodes: No enlarged mediastinal, hilar, or axillary lymph nodes. Thyroid gland, trachea, and esophagus demonstrate no significant findings. Lungs/Pleura: Mild centrilobular emphysema. Scattered small airways thickening in mucous impaction. No focal consolidation, pleural effusion, or pneumothorax. 4 mm solid pulmonary nodule in the medial left upper lobe (series 14, 87). Upper Abdomen: No acute abnormality. Musculoskeletal: No chest wall abnormality. No acute or significant osseous findings. Review of the MIP images confirms the above findings. IMPRESSION: 1. No evidence of pulmonary embolism. No acute intrathoracic process. 2. 4 mm solid pulmonary nodule in the medial left upper lobe. Per Fleischner Society Guidelines,if patient is low risk for malignancy, no routine follow-up imaging is recommended. If patient is high risk for malignancy, a non-contrast Chest CT at 12 months is optional. If performed and the nodule is stable at 12 months, no further follow-up is recommended. These guidelines do not apply to immunocompromised patients and patients  with cancer. Follow up in patients with significant comorbidities as clinically warranted. For lung cancer screening, adhere to Lung-RADS guidelines. Reference: Radiology. 2017; 284(1):228-43. 3. Aortic Atherosclerosis (ICD10-I70.0) and Emphysema (ICD10-J43.9). Electronically Signed   By: Obie Dredge M.D.   On: 12/15/2023 14:06   DG Chest Port 1 View Result Date: 12/15/2023 CLINICAL DATA:  Fever, flu like symptoms EXAM: PORTABLE CHEST 1 VIEW COMPARISON:  None Available. FINDINGS: Normal mediastinum and cardiac silhouette. Lungs are hyperinflated. Normal pulmonary vasculature. No evidence of effusion, infiltrate, or pneumothorax. No acute bony abnormality. IMPRESSION: 1. No evidence of pneumonia. 2. Hyperinflation suggests COPD. Electronically Signed   By: Genevive Bi M.D.   On: 12/15/2023 12:37    Procedures .Critical Care  Performed by: Netta Corrigan, PA-C Authorized by: Netta Corrigan, PA-C   Critical care provider statement:    Critical care time (minutes):  30   Critical care time was exclusive of:  Separately billable procedures and treating other patients   Critical care was necessary to treat or prevent imminent or life-threatening deterioration of the following conditions:  Respiratory failure  Critical care was time spent personally by me on the following activities:  Blood draw for specimens, development of treatment plan with patient or surrogate, discussions with consultants, evaluation of patient's response to treatment, examination of patient, obtaining history from patient or surrogate, review of old charts, re-evaluation of patient's condition, pulse oximetry, ordering and review of radiographic studies, ordering and review of laboratory studies and ordering and performing treatments and interventions   I assumed direction of critical care for this patient from another provider in my specialty: no     Care discussed with: admitting provider       Medications  Ordered in ED Medications  oseltamivir (TAMIFLU) capsule 75 mg (has no administration in time range)  predniSONE (DELTASONE) tablet 40 mg (has no administration in time range)  enoxaparin (LOVENOX) injection 40 mg (has no administration in time range)  acetaminophen (TYLENOL) tablet 650 mg (has no administration in time range)    Or  acetaminophen (TYLENOL) suppository 650 mg (has no administration in time range)  traZODone (DESYREL) tablet 25 mg (has no administration in time range)  ondansetron (ZOFRAN) tablet 4 mg (has no administration in time range)    Or  ondansetron (ZOFRAN) injection 4 mg (has no administration in time range)  albuterol (PROVENTIL) (2.5 MG/3ML) 0.083% nebulizer solution 2.5 mg (has no administration in time range)  ipratropium-albuterol (DUONEB) 0.5-2.5 (3) MG/3ML nebulizer solution 3 mL (has no administration in time range)    ED Course/ Medical Decision Making/ A&P                                 Medical Decision Making Amount and/or Complexity of Data Reviewed Labs: ordered.  Risk Prescription drug management. Decision regarding hospitalization.   Erika Buck 77 y.o. presented today for shortness of breath.  Working DDx that I considered at this time includes, but not limited to, asthma/COPD exacerbation, URI, viral illness, anemia, ACS, PE, pneumonia, pleural effusion, lung cancer.  R/o DDx: asthma/COPD exacerbation, anemia, ACS, PE, pneumonia, pleural effusion, lung cance: These are considered less likely due to history of present illness, physical exam, labs/imaging findings  Review of prior external notes: 12/15/2023 ED  Unique Tests and My Interpretation:  CBC: Leukocytosis 16 BMP: Pending  Social Determinants of Health: none  Discussion with Independent Historian:  Family members  Discussion of Management of Tests:  Ikram, MD hospitalist  Risk: High: hospitalization or escalation of hospital-level care  Risk Stratification Score:  none  Plan: On exam patient was in no acute distress was mildly tachycardic at 106 my evaluated her.  Patient does have wheezing or rhonchorous lung sounds on the right side however had entire workup done yesterday with imaging that was ultimately negative and diagnosed with influenza A.  Discussed smoking cessation with patient and was they were offerred resources to help stop.  Total time was 5 min CPT code 11914.   I spoke to the hospitalist and patient was accepted for admission.  Hospitalist recommends rechecking potassium as it was low yesterday which is reasonable and so we will add on CBC and BMP.  Patient stable for admission at this time.  Patient dropped down to 86% while ambulating was present 2 L nasal cannula.  Despite respiratory failure with hypoxia in the setting of influenza.  Patient placed on 2 L nasal cannula and will be admitted.  This chart was dictated using voice recognition software.  Despite best efforts to  proofread,  errors can occur which can change the documentation meaning.         Final Clinical Impression(s) / ED Diagnoses Final diagnoses:  Influenza  Acute respiratory failure with hypoxia [facility])    Rx / DC Orders ED Discharge Orders     None         Remi Deter 12/16/23 1106    Bethann Berkshire, MD 12/18/23 1059

## 2023-12-17 DIAGNOSIS — J101 Influenza due to other identified influenza virus with other respiratory manifestations: Secondary | ICD-10-CM | POA: Diagnosis not present

## 2023-12-17 LAB — BASIC METABOLIC PANEL
Anion gap: 9 (ref 5–15)
BUN: 14 mg/dL (ref 8–23)
CO2: 30 mmol/L (ref 22–32)
Calcium: 8.5 mg/dL — ABNORMAL LOW (ref 8.9–10.3)
Chloride: 95 mmol/L — ABNORMAL LOW (ref 98–111)
Creatinine, Ser: 0.71 mg/dL (ref 0.44–1.00)
GFR, Estimated: >60 mL/min (ref >60)
Glucose, Bld: 98 mg/dL (ref 70–99)
Potassium: 4.1 mmol/L (ref 3.5–5.1)
Sodium: 134 mmol/L — ABNORMAL LOW (ref 135–145)

## 2023-12-17 LAB — CBC
HCT: 40 % (ref 36.0–46.0)
Hemoglobin: 12.8 g/dL (ref 12.0–15.0)
MCH: 31.5 pg (ref 26.0–34.0)
MCHC: 32 g/dL (ref 30.0–36.0)
MCV: 98.5 fL (ref 80.0–100.0)
Platelets: 247 10*3/uL (ref 150–400)
RBC: 4.06 MIL/uL (ref 3.87–5.11)
RDW: 13.4 % (ref 11.5–15.5)
WBC: 10.7 10*3/uL — ABNORMAL HIGH (ref 4.0–10.5)
nRBC: 0 % (ref 0.0–0.2)

## 2023-12-17 MED ORDER — IPRATROPIUM-ALBUTEROL 0.5-2.5 (3) MG/3ML IN SOLN
3.0000 mL | Freq: Three times a day (TID) | RESPIRATORY_TRACT | Status: DC
  Administered 2023-12-17 – 2023-12-19 (×6): 3 mL via RESPIRATORY_TRACT
  Filled 2023-12-17 (×6): qty 3

## 2023-12-17 NOTE — Care Management Obs Status (Signed)
MEDICARE OBSERVATION STATUS NOTIFICATION   Patient Details  Name: Erika Buck MRN: [id number] Date of Birth: [birth year]   Medicare Observation Status Notification Given:  Yes    Adrian Prows, RN 12/17/2023, 10:58 AM

## 2023-12-17 NOTE — Discharge Instructions (Signed)
Patient given lit of [facility] Primary Care Providers    High-Calorie, High-Protein Nutrition Therapy (2021) A high-calorie, high-protein diet has been recommended to you. Your registered dietitian nutritionist (RDN) may have recommended this diet because you are having difficulty eating enough calories throughout the day, you have lost weight, and/or you need to add protein to your diet. Sometimes you may not feel like eating, even if you know the importance of good nutrition. The recommendations in this handout can help you with the following: Regaining your strength and energy Keeping your body healthy Healing and recovering from surgery or illness and fighting infection Tips: Schedule Your Meals and Snacks Several small meals and snacks are often better tolerated and digested than large meals. Strategies Plan to eat 3 meals and 3 snacks daily. Experiment with timing meals to find out when you have a larger appetite. Appetite may be greatest in the morning after not eating all night so you may prefer to eat your larger meals and snacks in the morning and at lunch. Breakfast-type foods are often better tolerated so eat foods such as eggs, pancakes, waffles and cereal for any meal or snack. Carry snacks with you so you are prepared to eat every 2 to 3 hours. Determine what works best for you if your body's cues for feeling hungry or full are not working. Eat a small meal or snack even if you don't feel hungry. Set a timer to remind you when it is time to eat. Take a walk before you eat (with health care provider's approval). Light or moderate physical activity can help you maintain muscle and increase your appetite. Make Eating Enjoyable Taking steps to make the experience enjoyable may help to increase your interest in eating and improve your appetite. Strategies: Eat with others whenever possible. Include your favorite foods to make meals more enjoyable. Try new foods. Save your beverage  for the end of the meal so that you have more room for food before you get full. Add Calories to Your Meals and Snacks Try adding calorie-dense foods so that each bite provides more nutrition. Strategies Drink milk, chocolate milk, soy milk, or smoothies instead of low-calorie beverages such as diet drinks or water. Cook with milk or soy milk instead of water when making dishes such as hot cereal, cocoa, or pudding. Add jelly, jam, honey, butter or margarine to bread and crackers. Add jam or fruit to ice cream and as a topping over cake. Mix dried fruit, nuts, granola, honey, or dry cereal with yogurt or hot cereals. Enjoy snacks such as milkshakes, smoothies, pudding, ice cream, or custard. Blend a fruit smoothie of a banana, frozen berries, milk or soy milk, and 1 tablespoon nonfat powdered milk or protein powder. Add Protein to Your Meals and Snacks Choose at least one protein food at each meal and snack to increase your daily intake. Strategies Add  cup nonfat dry milk powder or protein powder to make a high-protein milk to drink or to use in recipes that call for milk. Vanilla or peppermint extract or unsweetened cocoa powder could help to boost the flavor. Add hard-cooked eggs, leftover meat, grated cheese, canned beans or tofu to noodles, rice, salads, sandwiches, soups, casseroles, pasta, tuna and other mixed dishes. Add powdered milk or protein powder to hot cereals, meatloaf, casseroles, scrambled eggs, sauces, cream soups, and shakes. Add beans and lentils to salads, soups, casseroles, and vegetable dishes. Eat cottage cheese or yogurt, especially Greek yogurt, with fruit as a snack or  dessert. Eat peanut or other nut butters on crackers, bread, toast, waffles, apples, bananas or celery sticks. Add it to milkshakes, smoothies, or desserts. Consider a ready-made protein shake. Your RDN will make recommendations. Add Fats to Your Meals and Snacks Try adding fats to your meals and  snacks. Fat provides more calories in fewer bites than carbohydrate or protein and adds flavors to your foods. Strategies Snack on nuts and seeds or add them to foods like salads, pasta, cereals, yogurt, and ice cream.  Saut or stir-fry vegetables, meats, chicken, fish or tofu in olive or canola oil.  Add olive oil, other vegetable oils, butter or margarine to soups, vegetables, potatoes, cooked cereal, rice, pasta, bread, crackers, pancakes, or waffles. Snack on olives or add to pasta, pizza, or salad. Add avocado or guacamole to your salads, sandwiches, and other entrees. Include fatty fish such as salmon in your weekly meal plan. For general food safety tips, especially for clients with immunocompromised conditions, ask your RDN for the Food Safety Nutrition Therapy handout. Small Meal and Snack Ideas These snacks and meals are recommended when you have to eat but aren't necessarily hungry.  They are good choices because they are high in protein and high in calories.  2 graham crackers 2 tablespoons peanut or other nut butter 1 cup milk 2 slices whole wheat toast topped with:  avocado, mashed Seasoning of your choice   cup Greek yogurt  cup fruit  cup granola 2 deviled egg halves 5 whole wheat crackers  1 cup cream of tomato soup  grilled cheese sandwich 1 toasted waffle topped with: 2 tablespoons peanut or nut butter 1 tablespoon jam  Trail mix made with:  cup nuts  cup dried fruit  cup cold cereal, any variety  cup oatmeal or cream of wheat cereal 1 tablespoon peanut or nut butter  cup diced fruit   High-Calorie, High-Protein Sample 1-Day Menu View Nutrient Info Breakfast 1 egg, scrambled 1 ounce cheddar cheese 1 English muffin, whole wheat 1 tablespoon margarine 1 tablespoon jam  cup orange juice, fortified with calcium and vitamin D  Morning Snack 1 tablespoon peanut butter 1 banana 1 cup 1% milk  Lunch Tuna salad sandwich made with: 2 slices bread, whole  wheat 3 ounces tuna mixed with: 1 tablespoon mayonnaise  cup pudding  Afternoon Snack  cup hummus  cup carrots 1 pita  Evening Meal Enchilada casserole made with: 2 corn tortillas 3 ounces ground beef, cooked  cup black beans, cooked  cup corn, cooked 1 ounce grated cheddar cheese  cup enchilada sauce  avocado, sliced, topping for enchilada 1 tablespoon sour cream, topping for enchilada Salad:  cup lettuce, shredded  cup tomatoes, chopped, for salad 1 tablespoon olive oil and vinegar dressing, for salad  Evening Snack  cup Greek yogurt  cup blueberries  cup granola

## 2023-12-17 NOTE — Plan of Care (Signed)
  Problem: Clinical Measurements: Goal: Ability to maintain clinical measurements within normal limits will improve Outcome: Progressing Goal: Will remain free from infection Outcome: Progressing   

## 2023-12-17 NOTE — TOC Initial Note (Signed)
Transition of Care [facility]) - Initial/Assessment Note    Patient Details  Name: Erika Buck MRN: [id number] Date of Birth: March 22, 1946  [facility]) CM/SW Contact:    Adrian Prows, RN Phone Number: 12/17/2023, 11:08 AM  Clinical Narrative:                 Erika Buck w/ pt and dtr [name] [phone]) in room; pt says she lives at home; she plans to return at d/c; family will provide transportation at d/c; pt verifies her insurance, and she does not have a PCP; pt denies SDOH risks; she has a cane; pt says she does not have HH services or home oxygen; pt says she wants home oxygen; explained required documentation; pt says he she needs oxygen, she does not have an agency preference, and she wants a "small tank like they have on TV"; explained agency will determine size of equipment needed; pt also agrees to receive resource for [facility] PCPs; she will make appt w/ provider of choice; Dr Allena Katz given notification of pt request via secure chat; also MOON was explained to pt; she verbalized understanding, and document was signed; copy was given to pt; TOC will follow for d/c needs.  Expected Discharge Plan: Home/Self Care Barriers to Discharge: Continued Medical Work up   Patient Goals and CMS Choice Patient states their goals for this hospitalization and ongoing recovery are:: home CMS Medicare.gov Compare Post Acute Care list provided to:: Patient Choice offered to / list presented to : Patient  ownership interest in [facility]provided to:: Patient    Expected Discharge Plan and Services   Discharge Planning Services: CM Consult   Living arrangements for the past 2 months: Apartment                                      Prior Living Arrangements/Services Living arrangements for the past 2 months: Apartment Lives with:: Self Patient language and need for interpreter reviewed:: Yes Do you feel safe going back to the place where you live?:  Yes      Need for Family Participation in Patient Care: Yes (Comment) Care giver support system in place?: Yes (comment) Current home services: DME (cane) Criminal Activity/Legal Involvement Pertinent to Current Situation/Hospitalization: No - Comment as needed  Activities of Daily Living   ADL Screening (condition at time of admission) Independently performs ADLs?: Yes (appropriate for developmental age) Is the patient deaf or have difficulty hearing?: Yes Does the patient have difficulty seeing, even when wearing glasses/contacts?: No Does the patient have difficulty concentrating, remembering, or making decisions?: No  Permission Sought/Granted Permission sought to share information with : Case Manager Permission granted to share information with : Yes, Verbal Permission Granted  Share Information with NAME: Case Manager     Permission granted to share info w Relationship: [name] (dtr) [phone]     Emotional Assessment Appearance:: Appears stated age Attitude/Demeanor/Rapport: Gracious Affect (typically observed): Accepting Orientation: : Oriented to Self, Oriented to Place, Oriented to  Time, Oriented to Situation Alcohol / Substance Use: Not Applicable Psych Involvement: No (comment)  Admission diagnosis:  Influenza A [J10.1] Acute respiratory failure with hypoxia (HCC) [J96.01] Influenza [J11.1] Patient Active Problem List   Diagnosis Date Noted   Influenza A 12/16/2023   S/P partial colectomy 09/27/2011   Colonic mass 08/02/2011   PCP:  System, Provider Not In Pharmacy:  [id number] [place], [place] - [address] [place] [address] Phone: [phone] Fax: [phone]     Social Drivers of Health (SDOH) Social History: SDOH Screenings   Food Insecurity: No Food Insecurity (12/17/2023)  Housing: Low Risk  (12/17/2023)  Transportation Needs: No Transportation Needs (12/17/2023)  Utilities: Not At Risk  (12/17/2023)  Social Connections: Unknown (05/10/2022)   Received from [facility]  Tobacco Use: High Risk (12/16/2023)   SDOH Interventions: Food Insecurity Interventions: Intervention Not Indicated, Inpatient TOC Housing Interventions: Intervention Not Indicated, Inpatient TOC Transportation Interventions: Intervention Not Indicated, Inpatient TOC Utilities Interventions: Intervention Not Indicated, Inpatient TOC   Readmission Risk Interventions     No data to display

## 2023-12-17 NOTE — Progress Notes (Signed)
Mobility Specialist - Progress Note   12/17/23 1055  Oxygen Therapy  SpO2 96 %  O2 Device Room Air  O2 Flow Rate (L/min) 3 L/min  Patient Activity (if Appropriate) Ambulating  Mobility  Activity Ambulated independently in hallway  Level of Assistance Independent  Assistive Device None  Distance Ambulated (ft) 500 ft  Activity Response Tolerated well  Mobility Referral Yes  Mobility visit 1 Mobility  Mobility Specialist Start Time (ACUTE ONLY) 1034  Mobility Specialist Stop Time (ACUTE ONLY) 1054  Mobility Specialist Time Calculation (min) (ACUTE ONLY) 20 min   Pt received in bed and agreeable to mobility. No complaints during session. Pt to bed after session with all needs met.    Pre-mobility: 113 HR, 98% SpO2 (3L [place]) During mobility: 126 HR, 96% SpO2 (3L [place]) Post-mobility: 125 HR, 93% SPO2 (3L [place])  Chief Technology Officer

## 2023-12-17 NOTE — Progress Notes (Signed)
[facility] Progress Note Patient: Erika Buck ZOX:[id number] DOB: [birth year] DOA: 12/16/2023  DOS: the patient was seen and examined on 12/17/2023  Brief Hospital Course: PMH of active smoking, undiagnosed COPD present to the hospital with complaints of cough and shortness of breath.  Found to have influenza infection with a hypoxia.  Assessment and Plan: Acute hypoxic respiratory failure. Influenza A infection. Undiagnosed COPD with exacerbation. Presents with complaints of cough and shortness of breath.  Bilateral expiratory wheezing heard. Saturating 8684% on room air on exertion. Does not use oxygen at her baseline. Currently on steroids, Tamiflu. Likely will require oxygen on discharge.  Continue DuoNebs as well.  Active smoking. Recommended patient to quit smoking. Will monitor.  Solitary pulmonary nodule. Incidentally seen on the CT scan. Repeat noncontrast CT chest recommended in 12 months.  Severe hypokalemia. Reportedly had some diarrhea prior to admission. Currently potassium level is normal.  Mild hyponatremia. Improving. Clinically nonsignificant.  Subjective: No nausea no vomiting.  Ongoing cough and shortness of breath.  Reports fatigue and tiredness.  Reports occasional dizziness when she stands up in the morning as well.  Physical Exam: General: in Mild distress, No Rash Cardiovascular: S1 and S2 Present, No Murmur Respiratory: Good respiratory effort, Bilateral Air entry present. No Crackles, bilateral expiratory wheezes Abdomen: Bowel Sound present, No tenderness Extremities: No edema Neuro: Alert and oriented x3, no new focal deficit  Data Reviewed: I have Reviewed nursing notes, Vitals, and Lab results. Since last encounter, pertinent lab results CBC and BMP   . I have ordered test including CBC and BMP  .   Disposition: Status is: Observation Saturation dropping down to 86% on room air on exertion.  enoxaparin (LOVENOX) injection 40 mg  Start: 12/16/23 2200 SCDs Start: 12/16/23 1033   Family Communication: Daughter at bedside Level of care: Med-Surg   Vitals:   12/17/23 1330 12/17/23 1333 12/17/23 1356 12/17/23 1436  BP:    107/60  Pulse:    (!) 102  Resp:    16  Temp:    98.3 F (36.8 C)  TempSrc:      SpO2: (!) 86% 95% 98% 100%  Weight:      Height:         Author: Lynden Oxford, MD 12/17/2023 7:47 PM  Please look on [email or web address] to find out who is on call.

## 2023-12-18 DIAGNOSIS — Z8249 Family history of ischemic heart disease and other diseases of the circulatory system: Secondary | ICD-10-CM | POA: Diagnosis not present

## 2023-12-18 DIAGNOSIS — E871 Hypo-osmolality and hyponatremia: Secondary | ICD-10-CM | POA: Diagnosis present

## 2023-12-18 DIAGNOSIS — Z7952 Long term (current) use of systemic steroids: Secondary | ICD-10-CM | POA: Diagnosis not present

## 2023-12-18 DIAGNOSIS — Z7983 Long term (current) use of bisphosphonates: Secondary | ICD-10-CM | POA: Diagnosis not present

## 2023-12-18 DIAGNOSIS — R64 Cachexia: Secondary | ICD-10-CM | POA: Diagnosis present

## 2023-12-18 DIAGNOSIS — Z79899 Other long term (current) drug therapy: Secondary | ICD-10-CM | POA: Diagnosis not present

## 2023-12-18 DIAGNOSIS — F17200 Nicotine dependence, unspecified, uncomplicated: Secondary | ICD-10-CM | POA: Diagnosis present

## 2023-12-18 DIAGNOSIS — J101 Influenza due to other identified influenza virus with other respiratory manifestations: Secondary | ICD-10-CM | POA: Diagnosis present

## 2023-12-18 DIAGNOSIS — Z681 Body mass index (BMI) 19 or less, adult: Secondary | ICD-10-CM | POA: Diagnosis not present

## 2023-12-18 DIAGNOSIS — J9601 Acute respiratory failure with hypoxia: Secondary | ICD-10-CM | POA: Diagnosis present

## 2023-12-18 DIAGNOSIS — R636 Underweight: Secondary | ICD-10-CM | POA: Diagnosis present

## 2023-12-18 DIAGNOSIS — R911 Solitary pulmonary nodule: Secondary | ICD-10-CM | POA: Diagnosis present

## 2023-12-18 DIAGNOSIS — J441 Chronic obstructive pulmonary disease with (acute) exacerbation: Secondary | ICD-10-CM | POA: Diagnosis present

## 2023-12-18 DIAGNOSIS — Z1152 Encounter for screening for COVID-19: Secondary | ICD-10-CM | POA: Diagnosis not present

## 2023-12-18 DIAGNOSIS — E876 Hypokalemia: Secondary | ICD-10-CM | POA: Diagnosis present

## 2023-12-18 DIAGNOSIS — Z9049 Acquired absence of other specified parts of digestive tract: Secondary | ICD-10-CM | POA: Diagnosis not present

## 2023-12-18 DIAGNOSIS — R197 Diarrhea, unspecified: Secondary | ICD-10-CM | POA: Diagnosis present

## 2023-12-18 NOTE — Progress Notes (Signed)
Mobility Specialist - Progress Note   12/18/23 1016  Oxygen Therapy  SpO2 90 %  O2 Device Nasal Cannula  O2 Flow Rate (L/min) 2 L/min  Patient Activity (if Appropriate) Ambulating  Mobility  Activity Ambulated independently in hallway  Level of Assistance Independent  Assistive Device None  Distance Ambulated (ft) 500 ft  Activity Response Tolerated well  Mobility Referral Yes  Mobility visit 1 Mobility  Mobility Specialist Start Time (ACUTE ONLY) 1002  Mobility Specialist Stop Time (ACUTE ONLY) 1015  Mobility Specialist Time Calculation (min) (ACUTE ONLY) 13 min   Pt received in bed and agreeable to mobility. No complaints during session. Pt sated she felt better today while ambulating. Pt to bed after session with all needs met.    Pre-mobility: 104 HR, 91% SpO2 (2L [place]) During mobility: 105 HR, 90% SpO2 (2L [place]) Post-mobility: 110 HR, 94% SPO2 (2L [place])  Chief Technology Officer

## 2023-12-18 NOTE — Progress Notes (Signed)
PROGRESS NOTE  Erika Buck  ZOX:[id number] DOB: Nov 24, 1946 DOA: 12/16/2023 PCP: System, Provider Not In   Brief Narrative: Patient is a 77 year old female with history of undiagnosed COPD, active smoking who presented with shortness of breath, wheezing.  Influenza test came out to be positive.  She was hypoxic and had to put on oxygen.  Attempting to wean the oxygen and keep her on room air before discharge.  Possible discharge tomorrow.  Assessment & Plan:  Principal Problem:   Influenza A  Acute hypoxic respiratory failure: Secondary to COPD exacerbation and influenza A.  Currently on 2 L of oxygen.  Will wean her down and put her on room air today.  Check ambulatory oxygen tomorrow preparing discharge  Smoking: Counseled cessation.  COPD exacerbation: Not diagnosed yet .Presented with wheezing, hypoxia.  Currently free of wheezing.  Continue bronchodilators, inhalers.  She needs to follow-up with pulmonology as an outpatient.  Currently on prednisone  Influenza: Continue Tamiflu  Hypokalemia: Supplemented and corrected  Hyponatremia: Mild, continue to monitor  Underweight: Dietitian consulted       DVT prophylaxis:enoxaparin (LOVENOX) injection 40 mg Start: 12/16/23 2200 SCDs Start: 12/16/23 1033     Code Status: Full Code  Family Communication: Daughters at bedside  Patient status:Inpatent  Patient is from :home  Anticipated discharge WJ:XBJY  Estimated DC date:tomorrow   Consultants: None  Procedures:None  Antimicrobials:  Anti-infectives (From admission, onward)    Start     Dose/Rate Route Frequency Ordered Stop   12/16/23 2200  oseltamivir (TAMIFLU) capsule 75 mg  Status:  Discontinued        75 mg Oral Every 12 hours 12/16/23 1033 12/16/23 1541   12/16/23 2200  oseltamivir (TAMIFLU) capsule 30 mg        30 mg Oral Every 12 hours 12/16/23 1541 12/21/23 2159       Subjective: Patient seen and examined at bedside today.  Hemodynamically stable.   Comfortable.  Sitting in the bed.  Denies any worsening shortness of breath or cough.  Currently on 2 L of oxygen per minute.  Objective: Vitals:   12/17/23 2126 12/18/23 0500 12/18/23 0614 12/18/23 1016  BP: 120/61 (!) 154/79    Pulse: 94 87    Resp: 16 17    Temp: 98.5 F (36.9 C) 98 F (36.7 C)    TempSrc:      SpO2: 100% 97% 100% 90%  Weight:      Height:        Intake/Output Summary (Last 24 hours) at 12/18/2023 1146 Last data filed at 12/18/2023 1024 Gross per 24 hour  Intake 720 ml  Output --  Net 720 ml   Filed Weights   12/16/23 1126  Weight: 47.6 kg    Examination:  General exam: Overall comfortable, not in distress, pleasant elderly female, thin built HEENT: PERRL Respiratory system:  no wheezes or crackles , diminished air sounds bilaterally Cardiovascular system: S1 & S2 heard, RRR.  Gastrointestinal system: Abdomen is nondistended, soft and nontender. Central nervous system: Alert and oriented Extremities: No edema, no clubbing ,no cyanosis Skin: No rashes, no ulcers,no icterus     Data Reviewed: I have personally reviewed following labs and imaging studies  CBC: Recent Labs  Lab 12/15/23 1159 12/16/23 1030 12/17/23 0522  WBC 9.8 16.0* 10.7*  NEUTROABS 9.1* 14.7*  --   HGB 12.8 13.2 12.8  HCT 38.8 40.0 40.0  MCV 95.6 95.5 98.5  PLT 217 239 247   Basic Metabolic Panel: Recent  Labs  Lab 12/15/23 1159 12/16/23 1336 12/17/23 0522  NA 133* 132* 134*  K 2.7* 4.0 4.1  CL 95* 99 95*  CO2 23 24 30   GLUCOSE 249* 150* 98  BUN 10 12 14   CREATININE 0.84 0.65 0.71  CALCIUM 8.3* 8.3* 8.5*     Recent Results (from the past 240 hours)  Resp panel by RT-PCR (RSV, Flu A&B, Covid) Anterior Nasal Swab     Status: Abnormal   Collection Time: 12/15/23 11:29 AM   Specimen: Anterior Nasal Swab  Result Value Ref Range Status   SARS Coronavirus 2 by RT PCR NEGATIVE NEGATIVE Final    Comment: (NOTE) SARS-CoV-2 target nucleic acids are NOT  DETECTED.  The SARS-CoV-2 RNA is generally detectable in upper respiratory specimens during the acute phase of infection. The lowest concentration of SARS-CoV-2 viral copies this assay can detect is 138 copies/mL. A negative result does not preclude SARS-Cov-2 infection and should not be used as the sole basis for treatment or other patient management decisions. A negative result may occur with  improper specimen collection/handling, submission of specimen other than nasopharyngeal swab, presence of viral mutation(s) within the areas targeted by this assay, and inadequate number of viral copies(<138 copies/mL). A negative result must be combined with clinical observations, patient history, and epidemiological information. The expected result is Negative.  Fact Sheet for Patients:  BloggerCourse.com  Fact Sheet for Healthcare Providers:  SeriousBroker.it  This test is no t yet approved or cleared by the [place] FDA and  has been authorized for detection and/or diagnosis of SARS-CoV-2 by FDA under an Emergency Use Authorization (EUA). This EUA will remain  in effect (meaning this test can be used) for the duration of the COVID-19 declaration under Section 564(b)(1) of the Act, 21 U.S.C.section 360bbb-3(b)(1), unless the authorization is terminated  or revoked sooner.       Influenza A by PCR POSITIVE (A) NEGATIVE Final   Influenza B by PCR NEGATIVE NEGATIVE Final    Comment: (NOTE) The Xpert Xpress SARS-CoV-2/FLU/RSV plus assay is intended as an aid in the diagnosis of influenza from Nasopharyngeal swab specimens and should not be used as a sole basis for treatment. Nasal washings and aspirates are unacceptable for Xpert Xpress SARS-CoV-2/FLU/RSV testing.  Fact Sheet for Patients: BloggerCourse.com  Fact Sheet for Healthcare Providers: SeriousBroker.it  This test is not  yet approved or cleared by the [place] FDA and has been authorized for detection and/or diagnosis of SARS-CoV-2 by FDA under an Emergency Use Authorization (EUA). This EUA will remain in effect (meaning this test can be used) for the duration of the COVID-19 declaration under Section 564(b)(1) of the Act, 21 U.S.C. section 360bbb-3(b)(1), unless the authorization is terminated or revoked.     Resp Syncytial Virus by PCR NEGATIVE NEGATIVE Final    Comment: (NOTE) Fact Sheet for Patients: BloggerCourse.com  Fact Sheet for Healthcare Providers: SeriousBroker.it  This test is not yet approved or cleared by the [place] FDA and has been authorized for detection and/or diagnosis of SARS-CoV-2 by FDA under an Emergency Use Authorization (EUA). This EUA will remain in effect (meaning this test can be used) for the duration of the COVID-19 declaration under Section 564(b)(1) of the Act, 21 U.S.C. section 360bbb-3(b)(1), unless the authorization is terminated or revoked.  Performed at [facility], [address] [place], [place] [address]      Radiology Studies: No results found.  Scheduled Meds:  enoxaparin (LOVENOX) injection  40 mg  Subcutaneous Q24H   ipratropium-albuterol  3 mL Nebulization TID   oseltamivir  30 mg Oral Q12H   predniSONE  40 mg Oral Q breakfast   Continuous Infusions:   LOS: 0 days   Burnadette Pop, MD [facility] P12/23/2024, 11:46 AM

## 2023-12-18 NOTE — Plan of Care (Signed)
  Problem: Education: Goal: Knowledge of General Education information will improve Description: Including pain rating scale, medication(s)/side effects and non-pharmacologic comfort measures Outcome: Progressing   Problem: Health Behavior/Discharge Planning: Goal: Ability to manage health-related needs will improve Outcome: Progressing   Problem: Clinical Measurements: Goal: Respiratory complications will improve Outcome: Progressing   

## 2023-12-19 DIAGNOSIS — J101 Influenza due to other identified influenza virus with other respiratory manifestations: Secondary | ICD-10-CM | POA: Diagnosis not present

## 2023-12-19 MED ORDER — IPRATROPIUM-ALBUTEROL 0.5-2.5 (3) MG/3ML IN SOLN: 3.0000 mL | Freq: Four times a day (QID) | RESPIRATORY_TRACT | 1 refills | Status: DC | PRN

## 2023-12-19 MED ORDER — OSELTAMIVIR PHOSPHATE 30 MG PO CAPS: 30.0000 mg | ORAL_CAPSULE | Freq: Two times a day (BID) | ORAL | 0 refills | Status: AC

## 2023-12-19 MED ORDER — PREDNISONE 20 MG PO TABS: 40.0000 mg | ORAL_TABLET | Freq: Every day | ORAL | 0 refills | Status: AC

## 2023-12-19 MED ORDER — ENSURE ENLIVE PO LIQD
237.0000 mL | Freq: Two times a day (BID) | ORAL | Status: DC
Start: 2023-12-19 — End: 2023-12-19

## 2023-12-19 NOTE — Progress Notes (Signed)
Oxygen delivered to patient at the bedside prior to discharge.

## 2023-12-19 NOTE — Progress Notes (Signed)
Patient discharged home, IV removed, discharge paperwork provided and explained to patient as well as patient's family, patient and patient's family verbalized understanding, awaiting orders for home oxygen to be completed prior to patient's discharge.

## 2023-12-19 NOTE — Progress Notes (Signed)
SATURATION QUALIFICATIONS: (This note is used to comply with regulatory documentation for home oxygen)  Patient Saturations on Room Air at Rest = 96%  Patient Saturations on Room Air while Ambulating = 84%  Patient Saturations on 2 Liters of oxygen while Ambulating = 90%  Please briefly explain why patient needs home oxygen: Patient desats on room air while ambulating.

## 2023-12-19 NOTE — Progress Notes (Signed)
Initial Nutrition Assessment  DOCUMENTATION CODES:   Underweight  INTERVENTION:   -Ensure Plus High Protein po BID, each supplement provides 350 kcal and 20 grams of protein.   -Placed "High Calorie, High Protein" handout in AVS  NUTRITION DIAGNOSIS:   Increased nutrient needs related to chronic illness (COPD) as evidenced by estimated needs.  GOAL:   Patient will meet greater than or equal to 90% of their needs  MONITOR:   PO intake, Supplement acceptance, Labs, Weight trends, I & O's  REASON FOR ASSESSMENT:   Consult Assessment of nutrition requirement/status  ASSESSMENT:   77 year old female with history of undiagnosed COPD, active smoking who presented with shortness of breath, wheezing.  Influenza test came out to be positive.  Patient currently being treated for flu+, active smoker.  Currently consuming 50-100% of meals. Will order Ensure supplements given increased needs from acute illness and poor appetite.  Per weight records, weights have remained stable. Pt is underweight.  Medications reviewed.  Labs reviewed: Low Na  Influenza A +  NUTRITION - FOCUSED PHYSICAL EXAM:  Unable to complete, working remotely.  Diet Order:   Diet Order             Diet regular Room service appropriate? Yes; Fluid consistency: Thin  Diet effective now                   EDUCATION NEEDS:   Education needs have been addressed  Skin:  Skin Assessment: Reviewed RN Assessment  Last BM:  12/21  Height:   Ht Readings from Last 1 Encounters:  12/16/23 5\' 4"  (1.626 m)    Weight:   Wt Readings from Last 1 Encounters:  12/16/23 47.6 kg    BMI:  Body mass index is 18.02 kg/m.  Estimated Nutritional Needs:   Kcal:  1650-1850  Protein:  80-95g  Fluid:  1.8L/day  Tilda Franco, MS, RD, LDN Inpatient Clinical Dietitian Contact via Secure chat

## 2023-12-19 NOTE — Discharge Summary (Addendum)
Physician Discharge Summary  LARRA CACACE ZOX:[id number] DOB: December 03, 1946 DOA: 12/16/2023  PCP: System, Provider Not In  Admit date: 12/16/2023 Discharge date: 12/19/2023  Admitted From: Home Disposition:  Home  Discharge Condition:Stable CODE STATUS:FULL Diet recommendation:  Regular   Brief/Interim Summary: Patient is a 77 year old female with history of undiagnosed COPD, active smoking who presented with shortness of breath, wheezing.  Influenza test came out to be positive.  She was hypoxic and had to put on oxygen.  Immediately significant improved but desaturates on room air while ambulating.  Qualified for home oxygen for 2 L while ambulating.  Medically stable for discharge today  Following problems were addressed during the hospitalization:  Acute hypoxic respiratory failure: Secondary to COPD exacerbation and influenza A.  Weaned to room air on ambulation but still requiring 2 L of oxygen while ambulating.  DME oxygen ordered   smoking: Counseled cessation.   COPD exacerbation: Not diagnosed yet .Presented with wheezing, hypoxia.  Currently free of wheezing.  Continue bronchodilators, inhalers.  She needs to follow-up with pulmonology as an outpatient.  Continue prednisone to complete 5 days course   Influenza: Continue Tamiflu   Hypokalemia: Supplemented and corrected   Hyponatremia: Mild, stable   Underweight: Dietitian consulted   Discharge Diagnoses:  Principal Problem:   Influenza A    Discharge Instructions  Discharge Instructions     Diet general   Complete by: As directed    Discharge instructions   Complete by: As directed    1)Please take prescribed medications as instructed 2)Follow up with your PCP in a week   Increase activity slowly   Complete by: As directed       Allergies as of 12/19/2023   No Known Allergies      Medication List     STOP taking these medications    potassium chloride SA 20 MEQ tablet Commonly known as:  KLOR-CON M       TAKE these medications    albuterol 108 (90 Base) MCG/ACT inhaler Commonly known as: VENTOLIN HFA Inhale 1-2 puffs into the lungs every 6 (six) hours as needed for wheezing or shortness of breath.   aspirin 325 MG tablet Take 325 mg by mouth every 6 (six) hours as needed for moderate pain (pain score 4-6).   ipratropium-albuterol 0.5-2.5 (3) MG/3ML Soln Commonly known as: DUONEB Take 3 mLs by nebulization every 6 (six) hours as needed.   Magnesium Oxide 400 MG Caps Take 1 capsule (400 mg total) by mouth daily for 5 days.   oseltamivir 30 MG capsule Commonly known as: TAMIFLU Take 1 capsule (30 mg total) by mouth every 12 (twelve) hours for 4 doses. What changed:  medication strength how much to take   predniSONE 20 MG tablet Commonly known as: DELTASONE Take 2 tablets (40 mg total) by mouth daily with breakfast for 2 days. Start taking on: December 20, 2023               Durable Medical Equipment  (From admission, onward)           Start     Ordered   12/19/23 1108  For home use only DME oxygen  Once       Question Answer Comment  Length of Need Lifetime   Mode or (Route) Nasal cannula   Liters per Minute 2   Frequency Continuous (stationary and portable oxygen unit needed)   Oxygen delivery system Gas      12/19/23 1107   12/17/23  1332  For home use only DME Nebulizer machine  Once       Question Answer Comment  Patient needs a nebulizer to treat with the following condition COPD exacerbation (HCC)   Length of Need Lifetime   Additional equipment included Administration kit      12/17/23 1332            Follow-up Information     [facility] Follow up.   Contact information: (home oxygen and nebulizer)  [address], [address]  [place], [place] [address] [phone]               No Known Allergies  Consultations: None   Procedures/Studies: CT Angio Chest PE W/Cm &/Or Wo Cm Result Date:  12/15/2023 CLINICAL DATA:  Sudden onset shortness of breath this morning. EXAM: CT ANGIOGRAPHY CHEST WITH CONTRAST TECHNIQUE: Multidetector CT imaging of the chest was performed using the standard protocol during bolus administration of intravenous contrast. Multiplanar CT image reconstructions and MIPs were obtained to evaluate the vascular anatomy. RADIATION DOSE REDUCTION: This exam was performed according to the departmental dose-optimization program which includes automated exposure control, adjustment of the mA and/or kV according to patient size and/or use of iterative reconstruction technique. CONTRAST:  75mL OMNIPAQUE IOHEXOL 350 MG/ML SOLN COMPARISON:  Chest x-ray from same day. FINDINGS: Cardiovascular: Satisfactory opacification of the pulmonary arteries to the segmental level. No evidence of pulmonary embolism. Normal heart size. No pericardial effusion. No thoracic aortic aneurysm or dissection. Coronary, aortic arch, and branch vessel atherosclerotic vascular disease. Mediastinum/Nodes: No enlarged mediastinal, hilar, or axillary lymph nodes. Thyroid gland, trachea, and esophagus demonstrate no significant findings. Lungs/Pleura: Mild centrilobular emphysema. Scattered small airways thickening in mucous impaction. No focal consolidation, pleural effusion, or pneumothorax. 4 mm solid pulmonary nodule in the medial left upper lobe (series 14, 87). Upper Abdomen: No acute abnormality. Musculoskeletal: No chest wall abnormality. No acute or significant osseous findings. Review of the MIP images confirms the above findings. IMPRESSION: 1. No evidence of pulmonary embolism. No acute intrathoracic process. 2. 4 mm solid pulmonary nodule in the medial left upper lobe. Per Fleischner Society Guidelines,if patient is low risk for malignancy, no routine follow-up imaging is recommended. If patient is high risk for malignancy, a non-contrast Chest CT at 12 months is optional. If performed and the nodule is  stable at 12 months, no further follow-up is recommended. These guidelines do not apply to immunocompromised patients and patients with cancer. Follow up in patients with significant comorbidities as clinically warranted. For lung cancer screening, adhere to Lung-RADS guidelines. Reference: Radiology. 2017; 284(1):228-43. 3. Aortic Atherosclerosis (ICD10-I70.0) and Emphysema (ICD10-J43.9). Electronically Signed   By: Obie Dredge M.D.   On: 12/15/2023 14:06   DG Chest Port 1 View Result Date: 12/15/2023 CLINICAL DATA:  Fever, flu like symptoms EXAM: PORTABLE CHEST 1 VIEW COMPARISON:  None Available. FINDINGS: Normal mediastinum and cardiac silhouette. Lungs are hyperinflated. Normal pulmonary vasculature. No evidence of effusion, infiltrate, or pneumothorax. No acute bony abnormality. IMPRESSION: 1. No evidence of pneumonia. 2. Hyperinflation suggests COPD. Electronically Signed   By: Genevive Bi M.D.   On: 12/15/2023 12:37      Subjective: Patient seen and examined at bedside today.  Hemodynamically stable.  Comfortable.  On room air at rest.  Denies any worsening shortness of breath or cough.  Eager to go home  Discharge Exam: Vitals:   12/19/23 0910 12/19/23 0915  BP:    Pulse:    Resp:    Temp:  SpO2: 90% 96%   Vitals:   12/19/23 0827 12/19/23 0900 12/19/23 0910 12/19/23 0915  BP:      Pulse:      Resp:      Temp:      TempSrc:      SpO2: 98% (!) 84% 90% 96%  Weight:      Height:        General: Pt is alert, awake, not in acute distress Cardiovascular: RRR, S1/S2 +, no rubs, no gallops Respiratory: CTA bilaterally, no wheezing, no rhonchi Abdominal: Soft, NT, ND, bowel sounds + Extremities: no edema, no cyanosis    The results of significant diagnostics from this hospitalization (including imaging, microbiology, ancillary and laboratory) are listed below for reference.     Microbiology: Recent Results (from the past 240 hours)  Resp panel by RT-PCR (RSV,  Flu A&B, Covid) Anterior Nasal Swab     Status: Abnormal   Collection Time: 12/15/23 11:29 AM   Specimen: Anterior Nasal Swab  Result Value Ref Range Status   SARS Coronavirus 2 by RT PCR NEGATIVE NEGATIVE Final    Comment: (NOTE) SARS-CoV-2 target nucleic acids are NOT DETECTED.  The SARS-CoV-2 RNA is generally detectable in upper respiratory specimens during the acute phase of infection. The lowest concentration of SARS-CoV-2 viral copies this assay can detect is 138 copies/mL. A negative result does not preclude SARS-Cov-2 infection and should not be used as the sole basis for treatment or other patient management decisions. A negative result may occur with  improper specimen collection/handling, submission of specimen other than nasopharyngeal swab, presence of viral mutation(s) within the areas targeted by this assay, and inadequate number of viral copies(<138 copies/mL). A negative result must be combined with clinical observations, patient history, and epidemiological information. The expected result is Negative.  Fact Sheet for Patients:  BloggerCourse.com  Fact Sheet for Healthcare Providers:  SeriousBroker.it  This test is no t yet approved or cleared by the [place] FDA and  has been authorized for detection and/or diagnosis of SARS-CoV-2 by FDA under an Emergency Use Authorization (EUA). This EUA will remain  in effect (meaning this test can be used) for the duration of the COVID-19 declaration under Section 564(b)(1) of the Act, 21 U.S.C.section 360bbb-3(b)(1), unless the authorization is terminated  or revoked sooner.       Influenza A by PCR POSITIVE (A) NEGATIVE Final   Influenza B by PCR NEGATIVE NEGATIVE Final    Comment: (NOTE) The Xpert Xpress SARS-CoV-2/FLU/RSV plus assay is intended as an aid in the diagnosis of influenza from Nasopharyngeal swab specimens and should not be used as a sole basis for  treatment. Nasal washings and aspirates are unacceptable for Xpert Xpress SARS-CoV-2/FLU/RSV testing.  Fact Sheet for Patients: BloggerCourse.com  Fact Sheet for Healthcare Providers: SeriousBroker.it  This test is not yet approved or cleared by the [place] FDA and has been authorized for detection and/or diagnosis of SARS-CoV-2 by FDA under an Emergency Use Authorization (EUA). This EUA will remain in effect (meaning this test can be used) for the duration of the COVID-19 declaration under Section 564(b)(1) of the Act, 21 U.S.C. section 360bbb-3(b)(1), unless the authorization is terminated or revoked.     Resp Syncytial Virus by PCR NEGATIVE NEGATIVE Final    Comment: (NOTE) Fact Sheet for Patients: BloggerCourse.com  Fact Sheet for Healthcare Providers: SeriousBroker.it  This test is not yet approved or cleared by the [place] FDA and has been authorized for detection and/or diagnosis of SARS-CoV-2  by FDA under an Emergency Use Authorization (EUA). This EUA will remain in effect (meaning this test can be used) for the duration of the COVID-19 declaration under Section 564(b)(1) of the Act, 21 U.S.C. section 360bbb-3(b)(1), unless the authorization is terminated or revoked.  Performed at [facility], [address], [place], [place] [address]      Labs: BNP (last 3 results) Recent Labs    12/15/23 1159  BNP 58.7   Basic Metabolic Panel: Recent Labs  Lab 12/15/23 1159 12/16/23 1336 12/17/23 0522  NA 133* 132* 134*  K 2.7* 4.0 4.1  CL 95* 99 95*  CO2 23 24 30   GLUCOSE 249* 150* 98  BUN 10 12 14   CREATININE 0.84 0.65 0.71  CALCIUM 8.3* 8.3* 8.5*   Liver Function Tests: No results for input(s): "AST", "ALT", "ALKPHOS", "BILITOT", "PROT", "ALBUMIN" in the last 168 hours. No results for input(s): "LIPASE", "AMYLASE" in the last  168 hours. No results for input(s): "AMMONIA" in the last 168 hours. CBC: Recent Labs  Lab 12/15/23 1159 12/16/23 1030 12/17/23 0522  WBC 9.8 16.0* 10.7*  NEUTROABS 9.1* 14.7*  --   HGB 12.8 13.2 12.8  HCT 38.8 40.0 40.0  MCV 95.6 95.5 98.5  PLT 217 239 247   Cardiac Enzymes: No results for input(s): "CKTOTAL", "CKMB", "CKMBINDEX", "TROPONINI" in the last 168 hours. BNP: Invalid input(s): "POCBNP" CBG: No results for input(s): "GLUCAP" in the last 168 hours. D-Dimer No results for input(s): "DDIMER" in the last 72 hours. Hgb A1c No results for input(s): "HGBA1C" in the last 72 hours. Lipid Profile No results for input(s): "CHOL", "HDL", "LDLCALC", "TRIG", "CHOLHDL", "LDLDIRECT" in the last 72 hours. Thyroid function studies No results for input(s): "TSH", "T4TOTAL", "T3FREE", "THYROIDAB" in the last 72 hours.  Invalid input(s): "FREET3" Anemia work up No results for input(s): "VITAMINB12", "FOLATE", "FERRITIN", "TIBC", "IRON", "RETICCTPCT" in the last 72 hours. Urinalysis No results found for: "COLORURINE", "APPEARANCEUR", "LABSPEC", "PHURINE", "GLUCOSEU", "HGBUR", "BILIRUBINUR", "KETONESUR", "PROTEINUR", "UROBILINOGEN", "NITRITE", "LEUKOCYTESUR" Sepsis Labs Recent Labs  Lab 12/15/23 1159 12/16/23 1030 12/17/23 0522  WBC 9.8 16.0* 10.7*   Microbiology Recent Results (from the past 240 hours)  Resp panel by RT-PCR (RSV, Flu A&B, Covid) Anterior Nasal Swab     Status: Abnormal   Collection Time: 12/15/23 11:29 AM   Specimen: Anterior Nasal Swab  Result Value Ref Range Status   SARS Coronavirus 2 by RT PCR NEGATIVE NEGATIVE Final    Comment: (NOTE) SARS-CoV-2 target nucleic acids are NOT DETECTED.  The SARS-CoV-2 RNA is generally detectable in upper respiratory specimens during the acute phase of infection. The lowest concentration of SARS-CoV-2 viral copies this assay can detect is 138 copies/mL. A negative result does not preclude SARS-Cov-2 infection and  should not be used as the sole basis for treatment or other patient management decisions. A negative result may occur with  improper specimen collection/handling, submission of specimen other than nasopharyngeal swab, presence of viral mutation(s) within the areas targeted by this assay, and inadequate number of viral copies(<138 copies/mL). A negative result must be combined with clinical observations, patient history, and epidemiological information. The expected result is Negative.  Fact Sheet for Patients:  BloggerCourse.com  Fact Sheet for Healthcare Providers:  SeriousBroker.it  This test is no t yet approved or cleared by the [place] FDA and  has been authorized for detection and/or diagnosis of SARS-CoV-2 by FDA under an Emergency Use Authorization (EUA). This EUA will remain  in effect (meaning this  test can be used) for the duration of the COVID-19 declaration under Section 564(b)(1) of the Act, 21 U.S.C.section 360bbb-3(b)(1), unless the authorization is terminated  or revoked sooner.       Influenza A by PCR POSITIVE (A) NEGATIVE Final   Influenza B by PCR NEGATIVE NEGATIVE Final    Comment: (NOTE) The Xpert Xpress SARS-CoV-2/FLU/RSV plus assay is intended as an aid in the diagnosis of influenza from Nasopharyngeal swab specimens and should not be used as a sole basis for treatment. Nasal washings and aspirates are unacceptable for Xpert Xpress SARS-CoV-2/FLU/RSV testing.  Fact Sheet for Patients: BloggerCourse.com  Fact Sheet for Healthcare Providers: SeriousBroker.it  This test is not yet approved or cleared by the [place] FDA and has been authorized for detection and/or diagnosis of SARS-CoV-2 by FDA under an Emergency Use Authorization (EUA). This EUA will remain in effect (meaning this test can be used) for the duration of the COVID-19 declaration  under Section 564(b)(1) of the Act, 21 U.S.C. section 360bbb-3(b)(1), unless the authorization is terminated or revoked.     Resp Syncytial Virus by PCR NEGATIVE NEGATIVE Final    Comment: (NOTE) Fact Sheet for Patients: BloggerCourse.com  Fact Sheet for Healthcare Providers: SeriousBroker.it  This test is not yet approved or cleared by the [place] FDA and has been authorized for detection and/or diagnosis of SARS-CoV-2 by FDA under an Emergency Use Authorization (EUA). This EUA will remain in effect (meaning this test can be used) for the duration of the COVID-19 declaration under Section 564(b)(1) of the Act, 21 U.S.C. section 360bbb-3(b)(1), unless the authorization is terminated or revoked.  Performed at [facility], [address] [place], [place] [address]     Please note: You were cared for by a hospitalist during your hospital stay. Once you are discharged, your primary care physician will handle any further medical issues. Please note that NO REFILLS for any discharge medications will be authorized once you are discharged, as it is imperative that you return to your primary care physician (or establish a relationship with a primary care physician if you do not have one) for your post hospital discharge needs so that they can reassess your need for medications and monitor your lab values.    Time coordinating discharge: 40 minutes  SIGNED:   Burnadette Pop, MD  [facility] 12/19/2023, 2:38 PM Pager [phone]  If 7PM-7AM, please contact night-coverage [email or web address] Password [email or web address]

## 2023-12-19 NOTE — TOC Transition Note (Addendum)
Transition of Care [facility]) - Discharge Note   Patient Details  Name: Erika Buck MRN: [id number] Date of Birth: [birth year]  [facility] CM/SW Contact:  Adrian Prows, RN Phone Number: 12/19/2023, 11:47 AM   Clinical Narrative:    D/C orders received; also received orders for home oxygen and nebulizer w/ administration kit; spoke w/ pt; she agrees to receive recc devices; pt says she does not have an agency preference; spoke w/ Vaughan Basta at [place]; he says nebulizer and travel oxygen tank will be delivered to pt's room; pt notified and verbalized understanding; contact info for agency placed in follow up provider section of d/c instructions; no TOC needs.   Final next level of care: Home/Self Care Barriers to Discharge: No Barriers Identified   Patient Goals and CMS Choice Patient states their goals for this hospitalization and ongoing recovery are:: home CMS Medicare.gov Compare Post Acute Care list provided to:: Patient Choice offered to / list presented to : Patient [place] ownership interest in [facility].provided to:: Patient    Discharge Placement                       Discharge Plan and Services Additional resources added to the After Visit Summary for     Discharge Planning Services: CM Consult            DME Arranged: Nebulizer machine, Oxygen DME Agency: [employer] Date DME Agency Contacted: 12/19/23 Time DME Agency Contacted: 1147 Representative spoke with at DME Agency: Vaughan Basta HH Arranged: NA HH Agency: NA        Social Drivers of Health (SDOH) Interventions SDOH Screenings   Food Insecurity: No Food Insecurity (12/17/2023)  Housing: Low Risk  (12/17/2023)  Transportation Needs: No Transportation Needs (12/17/2023)  Utilities: Not At Risk (12/17/2023)  Social Connections: Unknown (05/10/2022)   Received from [facility]  Tobacco Use: High Risk (12/16/2023)     Readmission Risk Interventions      No data to display

## 2024-01-16 DIAGNOSIS — R636 Underweight: Secondary | ICD-10-CM | POA: Diagnosis not present

## 2024-01-16 DIAGNOSIS — Z23 Encounter for immunization: Secondary | ICD-10-CM | POA: Diagnosis not present

## 2024-01-16 DIAGNOSIS — J449 Chronic obstructive pulmonary disease, unspecified: Secondary | ICD-10-CM | POA: Diagnosis not present

## 2024-01-16 DIAGNOSIS — R000 Tachycardia, unspecified: Secondary | ICD-10-CM | POA: Diagnosis not present

## 2024-01-16 DIAGNOSIS — Z0000 Encounter for general adult medical examination without abnormal findings: Secondary | ICD-10-CM | POA: Diagnosis not present

## 2024-01-16 DIAGNOSIS — Z136 Encounter for screening for cardiovascular disorders: Secondary | ICD-10-CM | POA: Diagnosis not present

## 2024-01-16 DIAGNOSIS — Z13220 Encounter for screening for lipoid disorders: Secondary | ICD-10-CM | POA: Diagnosis not present

## 2024-01-16 DIAGNOSIS — Z87891 Personal history of nicotine dependence: Secondary | ICD-10-CM | POA: Diagnosis not present

## 2024-01-16 DIAGNOSIS — Z681 Body mass index (BMI) 19 or less, adult: Secondary | ICD-10-CM | POA: Diagnosis not present

## 2024-01-17 ENCOUNTER — Other Ambulatory Visit (HOSPITAL_COMMUNITY): Payer: Self-pay | Admitting: Family Medicine

## 2024-01-17 DIAGNOSIS — R000 Tachycardia, unspecified: Secondary | ICD-10-CM

## 2024-01-18 ENCOUNTER — Other Ambulatory Visit: Payer: Self-pay | Admitting: Family Medicine

## 2024-01-18 DIAGNOSIS — Z13820 Encounter for screening for osteoporosis: Secondary | ICD-10-CM

## 2024-01-19 ENCOUNTER — Other Ambulatory Visit (HOSPITAL_COMMUNITY): Payer: Medicare Other

## 2024-01-23 ENCOUNTER — Ambulatory Visit (HOSPITAL_COMMUNITY): Payer: Medicare Other | Attending: Cardiology

## 2024-01-23 DIAGNOSIS — I371 Nonrheumatic pulmonary valve insufficiency: Secondary | ICD-10-CM | POA: Insufficient documentation

## 2024-01-23 DIAGNOSIS — R000 Tachycardia, unspecified: Secondary | ICD-10-CM | POA: Insufficient documentation

## 2024-01-23 DIAGNOSIS — I340 Nonrheumatic mitral (valve) insufficiency: Secondary | ICD-10-CM

## 2024-01-23 DIAGNOSIS — I081 Rheumatic disorders of both mitral and tricuspid valves: Secondary | ICD-10-CM | POA: Insufficient documentation

## 2024-01-23 LAB — ECHOCARDIOGRAM COMPLETE
Area-P 1/2: 2.83 cm2
S' Lateral: 2.4 cm

## 2024-01-30 DIAGNOSIS — R030 Elevated blood-pressure reading, without diagnosis of hypertension: Secondary | ICD-10-CM | POA: Diagnosis not present

## 2024-01-30 DIAGNOSIS — R0602 Shortness of breath: Secondary | ICD-10-CM | POA: Diagnosis not present

## 2024-02-01 DIAGNOSIS — R000 Tachycardia, unspecified: Secondary | ICD-10-CM | POA: Diagnosis not present

## 2024-02-02 DIAGNOSIS — R000 Tachycardia, unspecified: Secondary | ICD-10-CM | POA: Diagnosis not present

## 2024-02-19 ENCOUNTER — Ambulatory Visit: Payer: Medicare Other | Admitting: Pulmonary Disease

## 2024-02-19 ENCOUNTER — Encounter: Payer: Self-pay | Admitting: Pulmonary Disease

## 2024-02-19 VITALS — BP 138/72 | HR 114 | Ht 64.0 in | Wt 87.0 lb

## 2024-02-19 DIAGNOSIS — J09X2 Influenza due to identified novel influenza A virus with other respiratory manifestations: Secondary | ICD-10-CM

## 2024-02-19 DIAGNOSIS — J9601 Acute respiratory failure with hypoxia: Secondary | ICD-10-CM | POA: Diagnosis not present

## 2024-02-19 DIAGNOSIS — R918 Other nonspecific abnormal finding of lung field: Secondary | ICD-10-CM | POA: Diagnosis not present

## 2024-02-19 DIAGNOSIS — R911 Solitary pulmonary nodule: Secondary | ICD-10-CM

## 2024-02-19 NOTE — Patient Instructions (Signed)
 Nice to meet you  Since you are doing better I recommend stopping the Spiriva  If the breathing seems worse after stopping the medication just resume it.  Call me and let me know.  We can arrange pulmonary function test and discuss other medications based on results in the future.  However, if you stay off the medication and you are back to your normal routine, no need for additional workup.  There was a small lung nodule on the CT scan in the hospital.  Will follow this up with a CT scan at 1 year interval, December 2025.  This was ordered today.  Someone will call and schedule this for you closer to the time requested.  Return to clinic in January 2026 to review CT scan or sooner as needed

## 2024-02-19 NOTE — Progress Notes (Signed)
 @Patient  ID: Erika Buck, female    DOB: 30-Nov-1946, 78 y.o.   MRN: [id number]  Chief Complaint  Patient presents with   Hospitalization Follow-up    Pt has been feeling well since being home, since getting over the flu she feels great. Pt does get sob while walking fast during exertion (x2 mth) Sent home w/ oxygen, not using it. Spiriva seems to dry throat out.    Referring provider: No ref. provider found  HPI:   78 y.o. woman whom are seen for hospital follow-up after influenza infection and acute hypoxemic respiratory failure.  Multiple hospital notes reviewed.  Was in good health.  Very active.  No real dyspnea.  Got ill 11/2023.  Fevers chills etc.  Difficulty breathing.  Went to the hospital.  Low oxygen saturation.  Was placed on submental oxygen.  Returned positive for flu A.  Treated with antibiotics and steroids.  Gradually got better.  Discharged on oxygen.  Still was quite short of breath for a week or 2 after discharge.  Gradually this is improved.  She is now back to walking regularly.  Walks her dog.  Can take stairs 2 at a time.  Her dyspnea has markedly improved.  Nearly back to baseline in terms of exercise capacity.  She is not using her oxygen.  Oxygen is staying mid 90s or higher with exertion and at rest.  She checks frequently at home.  She uses Breo.  Does not feel like it helps.  Because of some sore throat she thinks.  Uses albuterol occasionally.  Was using every day the week she was discharged.  Over the last month or 2 she is used very rarely.  Review chest x-ray admitted, clear, evidence of hyperinflation on single view.  CTA PE protocol obtained in the hospital reviewed interpreted as mild patchy emphysematous changes throughout, mosaicism noted, otherwise clear lungs.    Questionaires / Pulmonary Flowsheets:   ACT:      No data to display          MMRC:     No data to display          Epworth:      No data to display          Tests:    FENO:  No results found for: "NITRICOXIDE"  PFT:     No data to display          WALK:      No data to display          Imaging: Personally reviewed ECHOCARDIOGRAM COMPLETE Result Date: 01/23/2024    ECHOCARDIOGRAM REPORT   Patient Name:   [facility] ABYGAIL GALENO  Date of Exam: 01/23/2024 Medical Rec #:  [id number]     Height:       64.0 in Accession #:    [id number]    Weight:       105.0 lb Date of Birth:  03-Feb-1946     BSA:          1.488 m Patient Age:    77 years      BP:           138/82 mmHg Patient Gender: F             HR:           63 bpm. Exam Location:  [address] Procedure: 2D Echo, Cardiac Doppler, Color Doppler and 3D Echo Indications:    R00.0 Tachycardia  History:  Patient has no prior history of Echocardiogram examinations.  Sonographer:    Thurman Coyer RDCS Referring Phys: [id number] [facility] IMPRESSIONS  1. Left ventricular ejection fraction, by estimation, is 55 to 60%. Left ventricular ejection fraction by 3D volume is 58 %. The left ventricle has normal function. The left ventricle has no regional wall motion abnormalities. Left ventricular diastolic  parameters were normal.  2. Right ventricular systolic function is normal. The right ventricular size is normal. There is normal pulmonary artery systolic pressure. The estimated right ventricular systolic pressure is 35.5 mmHg.  3. The mitral valve is normal in structure. Mild mitral valve regurgitation.  4. The aortic valve is tricuspid. Aortic valve regurgitation is not visualized. No aortic stenosis is present.  5. Eccentric commisural jet of mild pulmonic insufficiency.  6. The inferior vena cava is dilated in size with >50% respiratory variability, suggesting right atrial pressure of 8 mmHg. Comparison(s): No prior Echocardiogram. FINDINGS  Left Ventricle: Left ventricular ejection fraction, by estimation, is 55 to 60%. Left ventricular ejection fraction by 3D volume is 58 %. The left ventricle has normal  function. The left ventricle has no regional wall motion abnormalities. The left ventricular internal cavity size was normal in size. There is no left ventricular hypertrophy. Left ventricular diastolic parameters were normal. Right Ventricle: The right ventricular size is normal. No increase in right ventricular wall thickness. Right ventricular systolic function is normal. There is normal pulmonary artery systolic pressure. The tricuspid regurgitant velocity is 2.62 m/s, and  with an assumed right atrial pressure of 8 mmHg, the estimated right ventricular systolic pressure is 35.5 mmHg. Left Atrium: Left atrial size was normal in size. Right Atrium: Right atrial size was normal in size. Pericardium: There is no evidence of pericardial effusion. Mitral Valve: The mitral valve is normal in structure. Mild mitral valve regurgitation. Tricuspid Valve: The tricuspid valve is normal in structure. Tricuspid valve regurgitation is mild . No evidence of tricuspid stenosis. Aortic Valve: The aortic valve is tricuspid. Aortic valve regurgitation is not visualized. No aortic stenosis is present. Pulmonic Valve: The pulmonic valve was normal in structure. Pulmonic valve regurgitation is mild. No evidence of pulmonic stenosis. Aorta: The aortic root and ascending aorta are structurally normal, with no evidence of dilitation. Pulmonary Artery: Eccentric commisural jet of mild pulmonic insufficiency. Venous: The inferior vena cava is dilated in size with greater than 50% respiratory variability, suggesting right atrial pressure of 8 mmHg. IAS/Shunts: The interatrial septum is aneurysmal. No atrial level shunt detected by color flow Doppler.  LEFT VENTRICLE PLAX 2D LVIDd:         3.80 cm         Diastology LVIDs:         2.40 cm         LV e' medial:    9.37 cm/s LV PW:         0.70 cm         LV E/e' medial:  8.2 LV IVS:        0.70 cm         LV e' lateral:   10.20 cm/s LVOT diam:     2.00 cm         LV E/e' lateral: 7.5 LV SV:          74 LV SV Index:   49 LVOT Area:     3.14 cm        3D Volume EF  LV 3D EF:    Left                                             ventricul                                             ar                                             ejection                                             fraction                                             by 3D                                             volume is                                             58 %.                                 3D Volume EF:                                3D EF:        58 %                                LV EDV:       77 ml                                LV ESV:       32 ml                                LV SV:        45 ml RIGHT VENTRICLE             IVC RV Basal diam:  3.60 cm     IVC diam: 2.10 cm RV Mid diam:    2.40 cm RV S prime:     13.80 cm/s TAPSE (M-mode): 2.6 cm LEFT ATRIUM             Index        RIGHT ATRIUM  Index LA diam:        2.30 cm 1.55 cm/m   RA Area:     8.40 cm LA Vol (A2C):   28.2 ml 18.96 ml/m  RA Volume:   15.50 ml 10.42 ml/m LA Vol (A4C):   18.4 ml 12.37 ml/m LA Biplane Vol: 24.3 ml 16.33 ml/m  AORTIC VALVE LVOT Vmax:   91.70 cm/s LVOT Vmean:  62.900 cm/s LVOT VTI:    0.234 m  AORTA Ao Root diam: 2.90 cm Ao Asc diam:  3.10 cm MITRAL VALVE               TRICUSPID VALVE MV Area (PHT): 2.83 cm    TR Peak grad:   27.5 mmHg MV Decel Time: 268 msec    TR Vmax:        262.00 cm/s MV E velocity: 76.80 cm/s MV A velocity: 61.20 cm/s  SHUNTS MV E/A ratio:  1.25        Systemic VTI:  0.23 m                            Systemic Diam: 2.00 cm Riley Lam MD Electronically signed by Riley Lam MD Signature Date/Time: 01/23/2024/2:38:31 PM    Final     Lab Results: Personally reviewed CBC    Component Value Date/Time   WBC 10.7 (H) 12/17/2023 0522   RBC 4.06 12/17/2023 0522   HGB 12.8 12/17/2023 0522   HCT 40.0 12/17/2023 0522   PLT 247 12/17/2023 0522   MCV  98.5 12/17/2023 0522   MCH 31.5 12/17/2023 0522   MCHC 32.0 12/17/2023 0522   RDW 13.4 12/17/2023 0522   LYMPHSABS 0.3 (L) 12/16/2023 1030   MONOABS 0.9 12/16/2023 1030   EOSABS 0.0 12/16/2023 1030   BASOSABS 0.0 12/16/2023 1030    BMET    Component Value Date/Time   NA 134 (L) 12/17/2023 0522   K 4.1 12/17/2023 0522   CL 95 (L) 12/17/2023 0522   CO2 30 12/17/2023 0522   GLUCOSE 98 12/17/2023 0522   BUN 14 12/17/2023 0522   CREATININE 0.71 12/17/2023 0522   CALCIUM 8.5 (L) 12/17/2023 0522   GFRNONAA >60 12/17/2023 0522   GFRAA >60 09/18/2011 0428    BNP    Component Value Date/Time   BNP 58.7 12/15/2023 1159    ProBNP No results found for: "PROBNP"  Specialty Problems       Pulmonary Problems   Influenza A    No Known Allergies   There is no immunization history on file for this patient.  Past Medical History:  Diagnosis Date   Wears dentures     Tobacco History: Social History   Tobacco Use  Smoking Status Former   Current packs/day: 0.50   Types: Cigarettes  Smokeless Tobacco Not on file  Tobacco Comments   Quit smoking 12/21   Counseling given: Not Answered Tobacco comments: Quit smoking 12/21   Continue to not smoke  Outpatient Encounter Medications as of 02/19/2024  Medication Sig   SPIRIVA HANDIHALER 18 MCG inhalation capsule Place 18 mcg into inhaler and inhale daily.   albuterol (VENTOLIN HFA) 108 (90 Base) MCG/ACT inhaler Inhale 1-2 puffs into the lungs every 6 (six) hours as needed for wheezing or shortness of breath.   aspirin 325 MG tablet Take 325 mg by mouth every 6 (six) hours as needed for moderate pain (pain score 4-6).   ipratropium-albuterol (DUONEB) 0.5-2.5 (3) MG/3ML SOLN  Take 3 mLs by nebulization every 6 (six) hours as needed.   No facility-administered encounter medications on file as of 02/19/2024.     Review of Systems  Review of Systems  No chest pain with exertion.  No orthopnea or PND.  Comprehensive review  of systems otherwise negative. Physical Exam  BP 138/72   Pulse (!) 114   Ht 5\' 4"  (1.626 m)   Wt 87 lb (39.5 kg)   SpO2 97%   BMI 14.93 kg/m   Wt Readings from Last 5 Encounters:  02/19/24 87 lb (39.5 kg)  12/16/23 105 lb (47.6 kg)  11/25/19 106 lb (48.1 kg)  10/11/11 101 lb 6.4 oz (46 kg)  09/27/11 99 lb 3.2 oz (45 kg)    BMI Readings from Last 5 Encounters:  02/19/24 14.93 kg/m  12/16/23 18.02 kg/m  11/25/19 18.19 kg/m  10/11/11 17.41 kg/m  09/27/11 17.03 kg/m     Physical Exam General: Thin, sitting up in chair Eyes: EOMI, no icterus Neck: Supple, no JVP Pulmonary: Distant, clear, normal work of breathing Cardiovascular: Tachycardic, regular rate and rhythm Abdomen: Nondistended by sounds present MSK: No synovitis, no joint effusion Neuro: Normal gait, no weakness Psych: Normal mood, full affect   Assessment & Plan:   Acute hypoxemic respiratory failure: In the setting of flu A.  Now resolved.  Oxygen saturations high 90s ambulating today in the clinic.  Order sent to discontinue oxygen.  Hyperinflation on chest x-ray: With mosaicism on CT scan.  Likely small airways disease versus sequela of emphysema seen on CT scan.  She essentially has no symptoms.  Back to considerable exertional capacity.  Albuterol as needed.  Okay to stop Spiriva.  Can resume in the future if needed  Lung nodule: 4 mm.  Per Fleischner criteria 1 year follow-up.  11/2024 CT scan ordered.   Return in about 45 weeks (around 12/30/2024) for f/u Dr. Judeth Horn, after CT scan.   Karren Burly, MD 02/19/2024   This appointment required 61 minutes of patient care (this includes precharting, chart review, review of results, face-to-face care, etc.).

## 2024-03-07 ENCOUNTER — Telehealth: Payer: Self-pay | Admitting: Pulmonary Disease

## 2024-03-07 NOTE — Telephone Encounter (Signed)
 Called and spoke to patient. She is requesting update on discontinuing oxygen. Order was placed to [employer] on 2/24, however it shows as pending.    PCC's, can you help with this?

## 2024-03-07 NOTE — Telephone Encounter (Signed)
 Patient states needs oxygen equipment to be picked up. Patient uses [employer]. Patient phone number is [phone].

## 2024-03-08 NOTE — Telephone Encounter (Signed)
 Fax confirmed and urgent message sent to [employer] through epic to confirm order processing

## 2024-03-08 NOTE — Telephone Encounter (Signed)
 Pt is aware of below message and voiced her understanding. Nothing further needed.

## 2024-03-13 ENCOUNTER — Telehealth: Payer: Self-pay | Admitting: Pulmonary Disease

## 2024-03-13 NOTE — Telephone Encounter (Signed)
 Spoke to patient. She stated that [employer] has not received order to d/c oxygen. Order was placed 2/24.  PCC's, can you guys help with this?

## 2024-03-13 NOTE — Telephone Encounter (Signed)
 Patient would like to discontinue the use of oxygen. [employer] says that she needs a physical written order in order for them to remove it from her home. Please contact patient when this is completed.

## 2024-03-15 NOTE — Telephone Encounter (Signed)
 Noted. Lm for patient to make her aware.

## 2024-03-18 NOTE — Telephone Encounter (Signed)
 Pt is aware below message and voiced her understanding.  Nothing further needed.

## 2024-06-24 DIAGNOSIS — J449 Chronic obstructive pulmonary disease, unspecified: Secondary | ICD-10-CM | POA: Diagnosis not present

## 2024-07-25 DIAGNOSIS — J449 Chronic obstructive pulmonary disease, unspecified: Secondary | ICD-10-CM | POA: Diagnosis not present

## 2024-08-25 DIAGNOSIS — J449 Chronic obstructive pulmonary disease, unspecified: Secondary | ICD-10-CM | POA: Diagnosis not present

## 2024-09-06 ENCOUNTER — Other Ambulatory Visit: Payer: Medicare Other

## 2024-09-10 ENCOUNTER — Ambulatory Visit (HOSPITAL_BASED_OUTPATIENT_CLINIC_OR_DEPARTMENT_OTHER)
Admission: RE | Admit: 2024-09-10 | Discharge: 2024-09-10 | Disposition: A | Discharge status: Home / Self Care | Source: Ambulatory Visit | Attending: Family Medicine | Admitting: Family Medicine

## 2024-09-10 DIAGNOSIS — M810 Age-related osteoporosis without current pathological fracture: Secondary | ICD-10-CM | POA: Insufficient documentation

## 2024-09-10 DIAGNOSIS — Z13820 Encounter for screening for osteoporosis: Secondary | ICD-10-CM | POA: Diagnosis not present

## 2024-09-10 DIAGNOSIS — Z780 Asymptomatic menopausal state: Secondary | ICD-10-CM | POA: Diagnosis not present

## 2024-09-24 DIAGNOSIS — J449 Chronic obstructive pulmonary disease, unspecified: Secondary | ICD-10-CM | POA: Diagnosis not present

## 2024-10-11 ENCOUNTER — Ambulatory Visit: Payer: Self-pay | Admitting: Pulmonary Disease

## 2024-10-11 DIAGNOSIS — R030 Elevated blood-pressure reading, without diagnosis of hypertension: Secondary | ICD-10-CM | POA: Diagnosis not present

## 2024-10-11 DIAGNOSIS — R0609 Other forms of dyspnea: Secondary | ICD-10-CM | POA: Diagnosis not present

## 2024-10-11 DIAGNOSIS — M810 Age-related osteoporosis without current pathological fracture: Secondary | ICD-10-CM | POA: Diagnosis not present

## 2024-10-11 NOTE — Telephone Encounter (Signed)
 Has acute on Monday with Dr.Olalere

## 2024-10-11 NOTE — Telephone Encounter (Signed)
 FYI Only or Action Required?: FYI only for provider.  Patient is followed in Pulmonology for COPD/Emphysema, last seen on 02/19/2024 by Hunsucker, Erika SAUNDERS, MD.  Called Nurse Triage reporting Shortness of Breath.  Symptoms began several weeks ago.  Interventions attempted: Rescue inhaler and Maintenance inhaler.  Symptoms are: gradually worsening.  Triage Disposition: See PCP When Office is Open (Within 3 Days)  Patient/caregiver understands and will follow disposition?:   Communication  Red Word that prompted transfer to Nurse Triage: Patient [phone] states was in pcp Dr. Alejandra Buck at [facility] physician [address] [place], [place], [place] [phone] office today and Dr. [name] states it sounds like COPD and did not want to give patient medications and advised patient to see Dr. [name]. Patient states shortness of breath and wheezing has happen more often especially when moving, is using ventilator and is helpful. Patient states no shortness of breath nor wheezing now, patient is sitting down right now. Patient is asking should she be seen now? Patient has an upcoming CT scan for 12/04/24 that Dr. [name] ordered. Please advise.      Reason for Disposition  [1] MODERATE longstanding difficulty breathing (e.g., speaks in phrases, SOB even at rest, pulse 100-120) AND [2] SAME as normal  Answer Assessment - Initial Assessment Questions Pt states she's had SOB with any little exertion. Started this summer with heat outside. But states now she feels it when she tries to even vacuum. Uses her inhaler but does not feel it works. Requesting appointment with pulm for poss COPD exacerbation.   1. RESPIRATORY STATUS: Describe your breathing? (e.g., wheezing, shortness of breath, unable to speak, severe coughing)      Pt states she has to take deeper breaths and stop to catch breath.  2. ONSET: When did this breathing problem begin?      Over the summer but  worsened the last couple of weeks 3. PATTERN Does the difficult breathing come and go, or has it been constant since it started?      Intermittent worse with exertion 4. SEVERITY: How bad is your breathing? (e.g., mild, moderate, severe)      Moderate at worse 5. RECURRENT SYMPTOM: Have you had difficulty breathing before? If Yes, ask: When was the last time? and What happened that time?      unsure 6. CARDIAC HISTORY: Do you have any history of heart disease? (e.g., heart attack, angina, bypass surgery, angioplasty)      denies 7. LUNG HISTORY: Do you have any history of lung disease?  (e.g., pulmonary embolus, asthma, emphysema)     COPD; mild scattered emphysema 8. CAUSE: What do you think is causing the breathing problem?      Unsure; possibly COPD 9. OTHER SYMPTOMS: Do you have any other symptoms? (e.g., chest pain, cough, dizziness, fever, runny nose)     denies 10. O2 SATURATION MONITOR:  Do you use an oxygen saturation monitor (pulse oximeter) at home? If Yes, ask: What is your reading (oxygen level) today? What is your usual oxygen saturation reading? (e.g., 95%)       Generally around 95%, currently 93%  12. TRAVEL: Have you traveled out of the country in the last month? (e.g., travel history, exposures)       denies  Protocols used: Breathing Difficulty-A-AH

## 2024-10-14 ENCOUNTER — Ambulatory Visit: Admitting: Pulmonary Disease

## 2024-10-14 ENCOUNTER — Encounter: Payer: Self-pay | Admitting: Pulmonary Disease

## 2024-10-14 VITALS — BP 132/82 | HR 119 | Temp 97.8°F | Ht 64.0 in | Wt 104.2 lb

## 2024-10-14 DIAGNOSIS — R0602 Shortness of breath: Secondary | ICD-10-CM | POA: Diagnosis not present

## 2024-10-14 DIAGNOSIS — R9389 Abnormal findings on diagnostic imaging of other specified body structures: Secondary | ICD-10-CM

## 2024-10-14 NOTE — Progress Notes (Signed)
 Erika Buck    [id number]    Jan 09, 1946  Primary Care Physician:System, Provider Not In  Referring Physician: No referring provider defined for this encounter.  Chief complaint:   Patient with a history of emphysema being seen for shortness of breath  HPI:  Increased shortness of breath over the past few months Shortness of breath with minimal exertion sometimes will from walking to her car she will get short of breath She feels she has to take a couple of extra breaths otherwise sometimes she is able to be active  She walks on a regular basis without dog up to about 45 minutes  Smoked for about 40 to 50 years diagnosed with emphysema  She does have an albuterol  inhaler which she uses infrequently  Does not use this on a regular basis  She experiences anxiety about her breathing, weather changes do affect her breathing Cold muggy conditions makes it worse  No previous PFT  Anxious whenever she goes for any follow-up visits  She was on Spiriva previously but with improvement in symptoms she stopped using it  Outpatient Encounter Medications as of 10/14/2024  Medication Sig   albuterol  (VENTOLIN  HFA) 108 (90 Base) MCG/ACT inhaler Inhale 2 puffs into the lungs every 6 (six) hours as needed.   aspirin 81 MG chewable tablet Chew 81 mg by mouth daily as needed for mild pain (pain score 1-3).   No facility-administered encounter medications on file as of 10/14/2024.    Allergies as of 10/14/2024   (No Known Allergies)    Past Medical History:  Diagnosis Date   Wears dentures     Past Surgical History:  Procedure Laterality Date   CHOLECYSTECTOMY  2003   COLON SURGERY     PARTIAL COLECTOMY  09/16/2011   Laparoscopic assisted right    Family History  Problem Relation Age of Onset   Heart disease Mother    Cancer Brother        adrenal glands    Social History   Socioeconomic History   Marital status: Widowed    Spouse name: Not on file    Number of children: Not on file   Years of education: Not on file   Highest education level: Not on file  Occupational History   Not on file  Tobacco Use   Smoking status: Former    Current packs/day: 0.50    Types: Cigarettes   Smokeless tobacco: Not on file   Tobacco comments:    Quit smoking 12/21  Substance and Sexual Activity   Alcohol use: No   Drug use: No   Sexual activity: Not on file  Other Topics Concern   Not on file  Social History Narrative   Not on file   Social Drivers of Health   Financial Resource Strain: Not on file  Food Insecurity: No Food Insecurity (12/17/2023)   Hunger Vital Sign    Worried About Running Out of Food in the Last Year: Never true    Ran Out of Food in the Last Year: Never true  Transportation Needs: No Transportation Needs (12/17/2023)   PRAPARE - Administrator, Civil Service (Medical): No    Lack of Transportation (Non-Medical): No  Physical Activity: Not on file  Stress: Not on file  Social Connections: Unknown (05/10/2022)   [facility]   Social Network    Social Network: Not on file  Intimate Partner Violence: Not At Risk (  12/17/2023)   Humiliation, Afraid, Rape, and Kick questionnaire    Fear of Current or Ex-Partner: No    Emotionally Abused: No    Physically Abused: No    Sexually Abused: No    Review of Systems  Respiratory:  Positive for shortness of breath. Negative for cough.     Vitals:   10/14/24 0855  BP: (!) 150/90  Pulse: (!) 119  Temp: 97.8 F (36.6 C)  SpO2: 94%     Physical Exam Constitutional:      Appearance: She is well-developed.  HENT:     Head: Normocephalic.     Nose: Nose normal.     Mouth/Throat:     Mouth: Mucous membranes are moist.  Eyes:     Pupils: Pupils are equal, round, and reactive to light.  Neck:     Thyroid : No thyromegaly.  Cardiovascular:     Rate and Rhythm: Normal rate and regular rhythm.     Heart sounds: No murmur heard.    No  friction rub.  Pulmonary:     Effort: No tachypnea or respiratory distress.     Breath sounds: No stridor. No wheezing or rhonchi.  Musculoskeletal:     Cervical back: Normal range of motion.  Lymphadenopathy:     Cervical: No cervical adenopathy.  Neurological:     Mental Status: She is alert.  Psychiatric:        Mood and Affect: Mood normal.    Data Reviewed: CT scan reviewed showing evidence of emphysema  Assessment/Plan: Shortness of breath on exertion  Abnormal CT scan of the chest showing evidence of emphysema  Reformed smoker  Anxiety may be playing a role in symptoms  Educated about proper use of inhaler Technique was reviewed and taught She can use a rescue inhaler before activities if she gets very short of breath with activities  Encouraged regular exercise to maintain lung function  Schedule patient for pulmonary function test  If anxiety is felt to be contributing to symptoms in a significant way may need something to manage anxiety  Follow-up in about 3 months  Encouraged to call with significant concerns  She has a CT scan scheduled for December which she will follow-up with  May need a maintenance inhaler if still short of breath despite using albuterol  as needed   Jennet Epley MD [facility] 10/14/2024, 8:59 AM  CC: No ref. provider found

## 2024-10-14 NOTE — Patient Instructions (Signed)
 We will schedule you for breathing study - This can be done on the next day you come in  Make sure you follow-up with the CT scan - We will send your message once we review the CT scan  Continue graded activities as tolerated  Use your albuterol  as needed  If you get to a situation where you are using albuterol  every day, we may need to go back to using a medication like Spiriva or another agent  Make sure you are staying active and exercising regularly

## 2024-10-18 DIAGNOSIS — M810 Age-related osteoporosis without current pathological fracture: Secondary | ICD-10-CM | POA: Diagnosis not present

## 2024-10-18 DIAGNOSIS — I10 Essential (primary) hypertension: Secondary | ICD-10-CM | POA: Diagnosis not present

## 2024-10-18 DIAGNOSIS — R911 Solitary pulmonary nodule: Secondary | ICD-10-CM | POA: Diagnosis not present

## 2024-12-04 ENCOUNTER — Ambulatory Visit (HOSPITAL_BASED_OUTPATIENT_CLINIC_OR_DEPARTMENT_OTHER)
Admission: RE | Admit: 2024-12-04 | Discharge: 2024-12-04 | Disposition: A | Discharge status: Home / Self Care | Source: Ambulatory Visit | Attending: Pulmonary Disease | Admitting: Pulmonary Disease

## 2024-12-04 DIAGNOSIS — R911 Solitary pulmonary nodule: Secondary | ICD-10-CM | POA: Insufficient documentation

## 2025-01-14 ENCOUNTER — Telehealth: Payer: Self-pay

## 2025-01-14 ENCOUNTER — Ambulatory Visit: Admitting: Pulmonary Disease

## 2025-01-14 ENCOUNTER — Ambulatory Visit

## 2025-01-14 ENCOUNTER — Encounter: Payer: Self-pay | Admitting: Pulmonary Disease

## 2025-01-14 VITALS — BP 134/89 | HR 117 | Ht 64.0 in | Wt 105.0 lb

## 2025-01-14 DIAGNOSIS — R0602 Shortness of breath: Secondary | ICD-10-CM

## 2025-01-14 DIAGNOSIS — Z87891 Personal history of nicotine dependence: Secondary | ICD-10-CM | POA: Diagnosis not present

## 2025-01-14 DIAGNOSIS — J310 Chronic rhinitis: Secondary | ICD-10-CM

## 2025-01-14 DIAGNOSIS — R911 Solitary pulmonary nodule: Secondary | ICD-10-CM

## 2025-01-14 DIAGNOSIS — J439 Emphysema, unspecified: Secondary | ICD-10-CM

## 2025-01-14 DIAGNOSIS — J449 Chronic obstructive pulmonary disease, unspecified: Secondary | ICD-10-CM

## 2025-01-14 LAB — PULMONARY FUNCTION TEST
DL/VA % pred: 62 %
DL/VA: 2.56 ml/min/mmHg/L
DLCO unc % pred: 43 %
DLCO unc: 8.28 ml/min/mmHg
FEF 25-75 Post: 0.37 L/s
FEF 25-75 Pre: 0.34 L/s
FEF2575-%Change-Post: 7 %
FEF2575-%Pred-Post: 23 %
FEF2575-%Pred-Pre: 21 %
FEV1-%Change-Post: 1 %
FEV1-%Pred-Post: 42 %
FEV1-%Pred-Pre: 41 %
FEV1-Post: 0.87 L
FEV1-Pre: 0.85 L
FEV1FVC-%Change-Post: -2 %
FEV1FVC-%Pred-Pre: 58 %
FEV6-%Change-Post: 3 %
FEV6-%Pred-Post: 74 %
FEV6-%Pred-Pre: 71 %
FEV6-Post: 1.93 L
FEV6-Pre: 1.86 L
FEV6FVC-%Change-Post: 0 %
FEV6FVC-%Pred-Post: 99 %
FEV6FVC-%Pred-Pre: 99 %
FVC-%Change-Post: 3 %
FVC-%Pred-Post: 74 %
FVC-%Pred-Pre: 72 %
FVC-Post: 2.05 L
FVC-Pre: 1.97 L
Post FEV1/FVC ratio: 42 %
Post FEV6/FVC ratio: 94 %
Pre FEV1/FVC ratio: 43 %
Pre FEV6/FVC Ratio: 94 %
RV % pred: 211 %
RV: 4.93 L
TLC % pred: 138 %
TLC: 6.99 L

## 2025-01-14 MED ORDER — UMECLIDINIUM-VILANTEROL 62.5-25 MCG/ACT IN AEPB: 1.0000 | INHALATION_SPRAY | Freq: Every day | RESPIRATORY_TRACT | 3 refills | Status: DC

## 2025-01-14 NOTE — Patient Instructions (Signed)
 Full pft performed today

## 2025-01-14 NOTE — Progress Notes (Signed)
 Full pft performed today

## 2025-01-14 NOTE — Progress Notes (Signed)
 "              Erika Buck    [id number]    03-24-1946  Primary Care Physician:System, Provider Not In  Referring Physician: Neda Jennet LABOR, MD [address] [place],  [place] [address]  Chief complaint:   Patient with shortness of breath Feels overall about the same  Discussed the use of AI scribe software for clinical note transcription with the patient, who gave verbal consent to proceed.  History of Present Illness Erika Buck is a 79 year old female with emphysema who presents with increased shortness of breath.  She experiences increased shortness of breath, particularly during activities such as walking her dog or sweeping. She needs to take extra breaths and experiences shortness of breath even when talking. She attributes some symptoms to the hot summer weather.  She has a history of emphysema. Her FEV1 is 41%, and her lung size is 138%. She has a high residual volume.  She uses albuterol  as needed but finds it ineffective. She has tried using it before walks and during episodes of shortness of breath without noticeable improvement.  She has a stable 4mm lung nodule, last checked in December with a CT scan.  She experiences nasal congestion and has tried [employer] and a neti pot. She uses Flonase intermittently when feeling stuffed up, but not consistently.  She quit smoking and walks her dog twice a day for 45 minutes to an hour to maintain activity.  No significant drop in oxygen levels, with readings usually around 95%. She sometimes experiences wheezing but not consistently.  Shortness of breath with significant exertion She does try to stay active, walks her dog on a regular basis  Recent trial with inhalers have not really made her feel any better especially with use of albuterol  She did use Spiriva in the past but the cost was over $200  She smoked for about 40 to 50 years was diagnosed with emphysema  Outpatient Encounter Medications as of 01/14/2025   Medication Sig   albuterol  (VENTOLIN  HFA) 108 (90 Base) MCG/ACT inhaler Inhale 2 puffs into the lungs every 6 (six) hours as needed.   alendronate (FOSAMAX) 70 MG tablet Take 70 mg by mouth once a week.   amLODipine (NORVASC) 5 MG tablet Take 5 mg by mouth daily.   aspirin 81 MG chewable tablet Chew 81 mg by mouth daily as needed for mild pain (pain score 1-3).   olmesartan (BENICAR) 20 MG tablet Take 20 mg by mouth daily.   umeclidinium-vilanterol (ANORO ELLIPTA ) 62.5-25 MCG/ACT AEPB Inhale 1 puff into the lungs daily.   No facility-administered encounter medications on file as of 01/14/2025.    Allergies as of 01/14/2025   (No Known Allergies)    Past Medical History:  Diagnosis Date   Wears dentures     Past Surgical History:  Procedure Laterality Date   CHOLECYSTECTOMY  2003   COLON SURGERY     PARTIAL COLECTOMY  09/16/2011   Laparoscopic assisted right    Family History  Problem Relation Age of Onset   Heart disease Mother    Cancer Brother        adrenal glands    Social History   Socioeconomic History   Marital status: Widowed    Spouse name: Not on file   Number of children: Not on file   Years of education: Not on file   Highest education level: Not on file  Occupational History  Not on file  Tobacco Use   Smoking status: Former    Current packs/day: 0.50    Types: Cigarettes   Smokeless tobacco: Not on file   Tobacco comments:    Quit smoking 12/21  Substance and Sexual Activity   Alcohol use: No   Drug use: No   Sexual activity: Not on file  Other Topics Concern   Not on file  Social History Narrative   Not on file   Social Drivers of Health   Tobacco Use: Medium Risk (01/14/2025)   Patient History    Smoking Tobacco Use: Former    Smokeless Tobacco Use: Unknown    Passive Exposure: Not on [employer] Strain: Not on file  Food Insecurity: No Food Insecurity (12/17/2023)   Hunger Vital Sign    Worried About Running Out of  Food in the Last Year: Never true    Ran Out of Food in the Last Year: Never true  Transportation Needs: No Transportation Needs (12/17/2023)   PRAPARE - Administrator, Civil Service (Medical): No    Lack of Transportation (Non-Medical): No  Physical Activity: Not on file  Stress: Not on file  Social Connections: Unknown (05/10/2022)   [facility]   Social Network    Social Network: Not on file  Intimate Partner Violence: Not At Risk (12/17/2023)   Humiliation, Afraid, Rape, and Kick questionnaire    Fear of Current or Ex-Partner: No    Emotionally Abused: No    Physically Abused: No    Sexually Abused: No  Depression (PHQ2-9): Not on file  Alcohol Screen: Not on file  Housing: Low Risk (12/17/2023)   Housing Stability Vital Sign    Unable to Pay for Housing in the Last Year: No    Number of Times Moved in the Last Year: 0    Homeless in the Last Year: No  Utilities: Not At Risk (12/17/2023)   [employer] Utilities    Threatened with loss of utilities: No  Health Literacy: Not on file    Review of Systems  HENT:  Positive for rhinorrhea.   Respiratory:  Positive for shortness of breath.     Vitals:   01/14/25 0957  BP: 134/89  Pulse: (!) 117  SpO2: 95%     Physical Exam Constitutional:      Appearance: Normal appearance.  HENT:     Head: Normocephalic.  Eyes:     General: No scleral icterus.    Pupils: Pupils are equal, round, and reactive to light.  Cardiovascular:     Rate and Rhythm: Normal rate and regular rhythm.     Heart sounds: No murmur heard.    No friction rub.  Pulmonary:     Effort: No respiratory distress.     Breath sounds: No stridor. No wheezing or rhonchi.     Comments: Poor air movement Musculoskeletal:     Cervical back: No rigidity or tenderness.  Neurological:     Mental Status: She is alert.  Psychiatric:        Mood and Affect: Mood normal.    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 Lung function tests show 72% of  expected lung capacity, 41% of expected expiratory flow in the first second, and a residual volume of 138%, indicating air trapping. Oxygen exchange is at 43%, below normal. Symptoms include increased dyspnea during activities such as walking and talking. Albuterol  is ineffective for her. Emphasis on maintaining lung function and preventing further decline. Importance of regular exercise and pulmonary rehabilitation discussed. Inhalers are the primary treatment to keep airways open and optimize lung function. - Prescribed Anoro inhaler for maintenance therapy. - Advised to use albuterol  as needed for acute dyspnea. - Encouraged regular exercise, including walking twice daily for 45 minutes to an hour. - Discussed potential for pulmonary rehabilitation if dyspnea worsens. - Instructed to monitor oxygen levels; supplemental oxygen may be needed if levels drop into the 80s.  Chronic rhinitis Recurrent nasal congestion and stuffiness. Previous use of Flonase was inconsistent. Flonase is most effective with continuous use. Discussed potential use of antihistamines like Xyzal for allergy-related symptoms. - Advised continuous use of Flonase, two sprays in each nostril once daily. - Recommended Xyzal for allergy-related symptoms, available in generic form at [employer] or [employer].  Solitary pulmonary nodule 4 mm pulmonary nodule identified on CT scan in December. Nodule is stable and not expected to change significantly. No need for repeat CT scan due to small size and low risk of malignancy. - No further imaging required unless  symptoms change.  Follow-up in about 3 months to see how she is doing  If Anoro is not covered, I encouraged her to ask the pharmacist about what is covered for COPD  Encouraged to stay active  Call us  with significant concerns  I personally spent a total of 30 minutes in the care of the patient today including preparing to see the patient, getting/reviewing separately obtained history, performing a medically appropriate exam/evaluation, counseling and educating, placing orders, documenting clinical information in the EHR, and independently interpreting results.    No orders of the defined types were placed in this encounter.     Jennet Epley MD [facility] 01/14/2025, 10:23 AM  CC: Epley Jennet LABOR, MD   "

## 2025-01-14 NOTE — Patient Instructions (Addendum)
 Your breathing study shows severe obstructive disease Your CAT scan did show emphysema  The spot in the lung is stable, and we do not need to follow-up with any CT  I have put in for an inhaler called Anoro -It is 1 inhalation a day -You can use your albuterol  as needed  Make sure you continue to stay active  Trial with Flonase for your nasal stuffiness and congestion If you need to, you can add Xyzal-this is good for allergy related nasal stuffiness it has to be used daily as well  Follow-up in 3 months  Call us  with significant concerns

## 2025-01-14 NOTE — Telephone Encounter (Signed)
 Copied from CRM [id number]. Topic: Clinical - Prescription Issue >> Jan 14, 2025  2:33 PM Rozanna MATSU wrote: Reason for CRM: pt called stated she called the pharmacy and the med for umeclidinium-vilanterol (ANORO ELLIPTA ) 62.5-25 MCG/ACT AEPB is $490 and she can not afford this, she called insurance company and they gave two alternatives Nucala and Symbicort she wants to know if one of these will work.    Please advise

## 2025-01-15 NOTE — Telephone Encounter (Signed)
 Patient called again this morning to make sure Dr. Neda received her message from yesterday.  I told her to give him a little more time to respond.  She was okay with that.

## 2025-01-16 ENCOUNTER — Encounter: Payer: Self-pay | Admitting: Pulmonary Disease

## 2025-01-17 ENCOUNTER — Other Ambulatory Visit: Payer: Self-pay | Admitting: Pulmonary Disease

## 2025-01-17 MED ORDER — BUDESONIDE-FORMOTEROL FUMARATE 160-4.5 MCG/ACT IN AERO: 2.0000 | INHALATION_SPRAY | Freq: Two times a day (BID) | RESPIRATORY_TRACT | 6 refills | Status: AC

## 2025-01-17 MED ORDER — TIOTROPIUM BROMIDE 18 MCG IN CAPS: 18.0000 ug | ORAL_CAPSULE | Freq: Every day | RESPIRATORY_TRACT | 2 refills | Status: DC

## 2025-01-18 ENCOUNTER — Encounter: Payer: Self-pay | Admitting: Pulmonary Disease

## 2025-01-24 NOTE — Telephone Encounter (Signed)
 Dr. Neda, please advise on change of medication

## 2025-04-15 ENCOUNTER — Ambulatory Visit: Admitting: Pulmonary Disease
